# Patient Record
Sex: Male | Born: 1935 | Race: White | Hispanic: No | Marital: Married | Smoking: Former smoker
Health system: Southern US, Community
[De-identification: ages and names within clinical notes are randomized; demographics above are authoritative.]

## PROBLEM LIST (undated history)

## (undated) DIAGNOSIS — N289 Disorder of kidney and ureter, unspecified: Secondary | ICD-10-CM

## (undated) DIAGNOSIS — G629 Polyneuropathy, unspecified: Secondary | ICD-10-CM

## (undated) DIAGNOSIS — G2581 Restless legs syndrome: Secondary | ICD-10-CM

## (undated) DIAGNOSIS — I499 Cardiac arrhythmia, unspecified: Secondary | ICD-10-CM

## (undated) DIAGNOSIS — I6529 Occlusion and stenosis of unspecified carotid artery: Secondary | ICD-10-CM

## (undated) DIAGNOSIS — I340 Nonrheumatic mitral (valve) insufficiency: Secondary | ICD-10-CM

## (undated) HISTORY — PX: CORONARY ARTERY BYPASS GRAFT: SHX141

---

## 2014-11-21 ENCOUNTER — Emergency Department (HOSPITAL_BASED_OUTPATIENT_CLINIC_OR_DEPARTMENT_OTHER)
Admission: EM | Admit: 2014-11-21 | Discharge: 2014-11-22 | Disposition: A | Discharge status: Home / Self Care | Payer: Medicare Other | Attending: Emergency Medicine | Admitting: Emergency Medicine

## 2014-11-21 ENCOUNTER — Encounter (HOSPITAL_BASED_OUTPATIENT_CLINIC_OR_DEPARTMENT_OTHER): Payer: Self-pay | Admitting: Emergency Medicine

## 2014-11-21 DIAGNOSIS — G629 Polyneuropathy, unspecified: Secondary | ICD-10-CM | POA: Diagnosis not present

## 2014-11-21 DIAGNOSIS — Y9389 Activity, other specified: Secondary | ICD-10-CM | POA: Insufficient documentation

## 2014-11-21 DIAGNOSIS — T63441A Toxic effect of venom of bees, accidental (unintentional), initial encounter: Secondary | ICD-10-CM | POA: Diagnosis present

## 2014-11-21 DIAGNOSIS — Z7982 Long term (current) use of aspirin: Secondary | ICD-10-CM | POA: Diagnosis not present

## 2014-11-21 DIAGNOSIS — Y9289 Other specified places as the place of occurrence of the external cause: Secondary | ICD-10-CM | POA: Insufficient documentation

## 2014-11-21 DIAGNOSIS — Z8679 Personal history of other diseases of the circulatory system: Secondary | ICD-10-CM | POA: Insufficient documentation

## 2014-11-21 DIAGNOSIS — Y998 Other external cause status: Secondary | ICD-10-CM | POA: Insufficient documentation

## 2014-11-21 DIAGNOSIS — Z880 Allergy status to penicillin: Secondary | ICD-10-CM | POA: Insufficient documentation

## 2014-11-21 DIAGNOSIS — Z87891 Personal history of nicotine dependence: Secondary | ICD-10-CM | POA: Insufficient documentation

## 2014-11-21 DIAGNOSIS — Z87448 Personal history of other diseases of urinary system: Secondary | ICD-10-CM | POA: Diagnosis not present

## 2014-11-21 HISTORY — DX: Restless legs syndrome: G25.81

## 2014-11-21 HISTORY — DX: Occlusion and stenosis of unspecified carotid artery: I65.29

## 2014-11-21 HISTORY — DX: Disorder of kidney and ureter, unspecified: N28.9

## 2014-11-21 HISTORY — DX: Polyneuropathy, unspecified: G62.9

## 2014-11-21 HISTORY — DX: Cardiac arrhythmia, unspecified: I49.9

## 2014-11-21 HISTORY — DX: Nonrheumatic mitral (valve) insufficiency: I34.0

## 2014-11-22 ENCOUNTER — Encounter (HOSPITAL_BASED_OUTPATIENT_CLINIC_OR_DEPARTMENT_OTHER): Payer: Self-pay | Admitting: Emergency Medicine

## 2014-11-22 DIAGNOSIS — T63441A Toxic effect of venom of bees, accidental (unintentional), initial encounter: Secondary | ICD-10-CM | POA: Diagnosis not present

## 2014-11-22 MED ORDER — FAMOTIDINE 20 MG PO TABS
20.0000 mg | ORAL_TABLET | Freq: Once | ORAL | Status: AC
  Administered 2014-11-22: 20 mg via ORAL
  Filled 2014-11-22: qty 1

## 2014-11-22 MED ORDER — PREDNISONE 10 MG PO TABS: 20.0000 mg | ORAL_TABLET | Freq: Every day | ORAL | Status: DC

## 2014-11-22 MED ORDER — EPINEPHRINE 0.3 MG/0.3ML IJ SOAJ: 0.3000 mg | Freq: Once | INTRAMUSCULAR | Status: DC

## 2014-11-22 MED ORDER — DIPHENHYDRAMINE HCL 25 MG PO CAPS
25.0000 mg | ORAL_CAPSULE | Freq: Once | ORAL | Status: AC
  Administered 2014-11-22: 25 mg via ORAL
  Filled 2014-11-22: qty 1

## 2014-11-22 MED ORDER — PREDNISONE 50 MG PO TABS
60.0000 mg | ORAL_TABLET | Freq: Once | ORAL | Status: AC
  Administered 2014-11-22: 60 mg via ORAL
  Filled 2014-11-22 (×2): qty 1

## 2014-11-22 MED ORDER — FAMOTIDINE 20 MG PO TABS: 20.0000 mg | ORAL_TABLET | Freq: Two times a day (BID) | ORAL | Status: DC
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
## 2014-11-22 NOTE — ED Provider Notes (Signed)
CSN: [id number]     Arrival date & time 11/21/14  2238 History   First MD Initiated Contact with Patient 11/21/14 2359     Chief Complaint  Patient presents with  . Insect Bite     (Consider location/radiation/quality/duration/timing/severity/associated sxs/prior Treatment) Patient is a 81 y.o. male presenting with allergic reaction. The history is provided by the patient.  Allergic Reaction Presenting symptoms: itching and rash   Presenting symptoms: no difficulty breathing, no difficulty swallowing and no wheezing   Severity:  Moderate Prior allergic episodes:  No prior episodes Context: insect bite/sting   Context: no food allergies and no grass   Relieved by:  Nothing Worsened by:  Nothing tried Ineffective treatments:  Antihistamines   Past Medical History  Diagnosis Date  . Mitral valve regurgitation   . Carotid stenosis, non-symptomatic   . Restless leg syndrome   . Peripheral neuropathy   . Irregular heart rate   . Renal disorder    Past Surgical History  Procedure Laterality Date  . Coronary artery bypass graft     History reviewed. No pertinent family history. Social History  Substance Use Topics  . Smoking status: Former Games developer  . Smokeless tobacco: None  . Alcohol Use: No    Review of Systems  HENT: Negative for trouble swallowing.   Respiratory: Negative for wheezing.   Skin: Positive for itching and rash.  All other systems reviewed and are negative.     Allergies  Levaquin; Lipitor; Zithromax; and Penicillins  Home Medications   Prior to Admission medications   Medication Sig Start Date End Date Taking? Authorizing Provider  amLODipine-benazepril (LOTREL) 10-20 MG per capsule Take 1 capsule by mouth daily.   Yes Historical Provider, MD  aspirin 81 MG tablet Take 81 mg by mouth daily.   Yes Historical Provider, MD  fexofenadine (ALLEGRA) 180 MG tablet Take 180 mg by mouth daily.   Yes Historical Provider, MD  gabapentin (NEURONTIN) 300 MG  capsule Take 300 mg by mouth 3 (three) times daily.   Yes Historical Provider, MD  montelukast (SINGULAIR) 10 MG tablet Take 10 mg by mouth at bedtime.   Yes Historical Provider, MD  simvastatin (ZOCOR) 80 MG tablet Take 80 mg by mouth daily.   Yes Historical Provider, MD   BP 130/92 mmHg  Pulse 65  Temp(Src) 97.8 F (36.6 C) (Oral)  Resp 18  Ht 5\' 10"  (1.778 m)  Wt 203 lb (92.08 kg)  BMI 29.13 kg/m2  SpO2 100% Physical Exam  Constitutional: He is oriented to person, place, and time. He appears well-developed and well-nourished. No distress.  HENT:  Head: Normocephalic and atraumatic.  Mouth/Throat: Oropharynx is clear and moist. No oropharyngeal exudate.  No swelling of the lips tongue or uvula  Eyes: Conjunctivae are normal. Pupils are equal, round, and reactive to light.  Neck: Normal range of motion. Neck supple.  Cardiovascular: Normal rate, regular rhythm and intact distal pulses.   Pulmonary/Chest: Effort normal and breath sounds normal. No stridor. No respiratory distress. He has no wheezes. He has no rales.  Abdominal: Soft. Bowel sounds are normal. There is no tenderness. There is no rebound and no guarding.  Musculoskeletal: Normal range of motion.  Neurological: He is alert and oriented to person, place, and time.  Skin: Skin is warm and dry.  Mild whelps at sites of sting    ED Course  Procedures (including critical care time) Labs Review Labs Reviewed - No data to display  Imaging Review No results  found. I, Landry Kamath-RASCH,Barb Shear K, personally reviewed and evaluated these images and lab results as part of my medical decision-making.   EKG Interpretation None      MDM   Final diagnoses:  None   Steroids x 5 days with benadryl every 6 hours, pepcid BIX x 5 days and rx for epi pen.  Strict return precautions given    Xoe Hoe, MD 11/22/14 [phone]

## 2015-04-12 ENCOUNTER — Emergency Department (HOSPITAL_COMMUNITY): Payer: No Typology Code available for payment source

## 2015-04-12 ENCOUNTER — Inpatient Hospital Stay (HOSPITAL_COMMUNITY)
Admission: EM | Admit: 2015-04-12 | Discharge: 2015-05-20 | DRG: 003 | Disposition: A | Discharge status: Other Acute Inpatient Hospital | Payer: No Typology Code available for payment source | Attending: General Surgery | Admitting: General Surgery

## 2015-04-12 ENCOUNTER — Encounter (HOSPITAL_COMMUNITY): Payer: Self-pay | Admitting: Radiology

## 2015-04-12 ENCOUNTER — Inpatient Hospital Stay (HOSPITAL_COMMUNITY): Payer: No Typology Code available for payment source

## 2015-04-12 DIAGNOSIS — T1490 Injury, unspecified: Secondary | ICD-10-CM | POA: Diagnosis not present

## 2015-04-12 DIAGNOSIS — G9340 Encephalopathy, unspecified: Secondary | ICD-10-CM | POA: Diagnosis not present

## 2015-04-12 DIAGNOSIS — S2239XA Fracture of one rib, unspecified side, initial encounter for closed fracture: Secondary | ICD-10-CM | POA: Diagnosis present

## 2015-04-12 DIAGNOSIS — S32031A Stable burst fracture of third lumbar vertebra, initial encounter for closed fracture: Principal | ICD-10-CM | POA: Diagnosis present

## 2015-04-12 DIAGNOSIS — E876 Hypokalemia: Secondary | ICD-10-CM | POA: Diagnosis not present

## 2015-04-12 DIAGNOSIS — R609 Edema, unspecified: Secondary | ICD-10-CM

## 2015-04-12 DIAGNOSIS — I350 Nonrheumatic aortic (valve) stenosis: Secondary | ICD-10-CM | POA: Diagnosis not present

## 2015-04-12 DIAGNOSIS — J189 Pneumonia, unspecified organism: Secondary | ICD-10-CM | POA: Diagnosis not present

## 2015-04-12 DIAGNOSIS — Z930 Tracheostomy status: Secondary | ICD-10-CM

## 2015-04-12 DIAGNOSIS — I2510 Atherosclerotic heart disease of native coronary artery without angina pectoris: Secondary | ICD-10-CM | POA: Diagnosis present

## 2015-04-12 DIAGNOSIS — Z781 Physical restraint status: Secondary | ICD-10-CM

## 2015-04-12 DIAGNOSIS — T17990A Other foreign object in respiratory tract, part unspecified in causing asphyxiation, initial encounter: Secondary | ICD-10-CM | POA: Diagnosis not present

## 2015-04-12 DIAGNOSIS — S298XXA Other specified injuries of thorax, initial encounter: Secondary | ICD-10-CM | POA: Diagnosis not present

## 2015-04-12 DIAGNOSIS — M7989 Other specified soft tissue disorders: Secondary | ICD-10-CM

## 2015-04-12 DIAGNOSIS — J9601 Acute respiratory failure with hypoxia: Secondary | ICD-10-CM | POA: Diagnosis not present

## 2015-04-12 DIAGNOSIS — G629 Polyneuropathy, unspecified: Secondary | ICD-10-CM | POA: Diagnosis present

## 2015-04-12 DIAGNOSIS — R402434 Glasgow coma scale score 3-8, 24 hours or more after hospital admission: Secondary | ICD-10-CM | POA: Diagnosis not present

## 2015-04-12 DIAGNOSIS — I469 Cardiac arrest, cause unspecified: Secondary | ICD-10-CM | POA: Diagnosis not present

## 2015-04-12 DIAGNOSIS — R739 Hyperglycemia, unspecified: Secondary | ICD-10-CM | POA: Diagnosis not present

## 2015-04-12 DIAGNOSIS — J9600 Acute respiratory failure, unspecified whether with hypoxia or hypercapnia: Secondary | ICD-10-CM | POA: Insufficient documentation

## 2015-04-12 DIAGNOSIS — I4891 Unspecified atrial fibrillation: Secondary | ICD-10-CM | POA: Diagnosis not present

## 2015-04-12 DIAGNOSIS — E871 Hypo-osmolality and hyponatremia: Secondary | ICD-10-CM | POA: Diagnosis not present

## 2015-04-12 DIAGNOSIS — Z87891 Personal history of nicotine dependence: Secondary | ICD-10-CM

## 2015-04-12 DIAGNOSIS — N179 Acute kidney failure, unspecified: Secondary | ICD-10-CM | POA: Diagnosis not present

## 2015-04-12 DIAGNOSIS — J9620 Acute and chronic respiratory failure, unspecified whether with hypoxia or hypercapnia: Secondary | ICD-10-CM | POA: Diagnosis not present

## 2015-04-12 DIAGNOSIS — D62 Acute posthemorrhagic anemia: Secondary | ICD-10-CM | POA: Diagnosis not present

## 2015-04-12 DIAGNOSIS — G931 Anoxic brain damage, not elsewhere classified: Secondary | ICD-10-CM | POA: Diagnosis not present

## 2015-04-12 DIAGNOSIS — Z79899 Other long term (current) drug therapy: Secondary | ICD-10-CM

## 2015-04-12 DIAGNOSIS — J9691 Respiratory failure, unspecified with hypoxia: Secondary | ICD-10-CM

## 2015-04-12 DIAGNOSIS — Z419 Encounter for procedure for purposes other than remedying health state, unspecified: Secondary | ICD-10-CM

## 2015-04-12 DIAGNOSIS — I5033 Acute on chronic diastolic (congestive) heart failure: Secondary | ICD-10-CM | POA: Diagnosis not present

## 2015-04-12 DIAGNOSIS — R410 Disorientation, unspecified: Secondary | ICD-10-CM | POA: Insufficient documentation

## 2015-04-12 DIAGNOSIS — S82402A Unspecified fracture of shaft of left fibula, initial encounter for closed fracture: Secondary | ICD-10-CM | POA: Diagnosis present

## 2015-04-12 DIAGNOSIS — J9690 Respiratory failure, unspecified, unspecified whether with hypoxia or hypercapnia: Secondary | ICD-10-CM | POA: Diagnosis not present

## 2015-04-12 DIAGNOSIS — T148XXA Other injury of unspecified body region, initial encounter: Secondary | ICD-10-CM

## 2015-04-12 DIAGNOSIS — S02611A Fracture of condylar process of right mandible, initial encounter for closed fracture: Secondary | ICD-10-CM | POA: Diagnosis present

## 2015-04-12 DIAGNOSIS — A419 Sepsis, unspecified organism: Secondary | ICD-10-CM | POA: Diagnosis not present

## 2015-04-12 DIAGNOSIS — Z951 Presence of aortocoronary bypass graft: Secondary | ICD-10-CM

## 2015-04-12 DIAGNOSIS — J9621 Acute and chronic respiratory failure with hypoxia: Secondary | ICD-10-CM | POA: Diagnosis not present

## 2015-04-12 DIAGNOSIS — S0990XA Unspecified injury of head, initial encounter: Secondary | ICD-10-CM

## 2015-04-12 DIAGNOSIS — R1310 Dysphagia, unspecified: Secondary | ICD-10-CM | POA: Diagnosis not present

## 2015-04-12 DIAGNOSIS — S32029A Unspecified fracture of second lumbar vertebra, initial encounter for closed fracture: Secondary | ICD-10-CM | POA: Diagnosis present

## 2015-04-12 DIAGNOSIS — J690 Pneumonitis due to inhalation of food and vomit: Secondary | ICD-10-CM | POA: Diagnosis not present

## 2015-04-12 DIAGNOSIS — S32039S Unspecified fracture of third lumbar vertebra, sequela: Secondary | ICD-10-CM | POA: Diagnosis not present

## 2015-04-12 DIAGNOSIS — Y92410 Unspecified street and highway as the place of occurrence of the external cause: Secondary | ICD-10-CM | POA: Diagnosis not present

## 2015-04-12 DIAGNOSIS — E861 Hypovolemia: Secondary | ICD-10-CM | POA: Diagnosis not present

## 2015-04-12 DIAGNOSIS — S91312A Laceration without foreign body, left foot, initial encounter: Secondary | ICD-10-CM | POA: Diagnosis present

## 2015-04-12 DIAGNOSIS — R579 Shock, unspecified: Secondary | ICD-10-CM

## 2015-04-12 DIAGNOSIS — E874 Mixed disorder of acid-base balance: Secondary | ICD-10-CM | POA: Diagnosis not present

## 2015-04-12 DIAGNOSIS — Z4659 Encounter for fitting and adjustment of other gastrointestinal appliance and device: Secondary | ICD-10-CM

## 2015-04-12 DIAGNOSIS — E46 Unspecified protein-calorie malnutrition: Secondary | ICD-10-CM | POA: Diagnosis not present

## 2015-04-12 DIAGNOSIS — Y95 Nosocomial condition: Secondary | ICD-10-CM | POA: Diagnosis not present

## 2015-04-12 DIAGNOSIS — F0390 Unspecified dementia without behavioral disturbance: Secondary | ICD-10-CM | POA: Diagnosis present

## 2015-04-12 DIAGNOSIS — R0902 Hypoxemia: Secondary | ICD-10-CM

## 2015-04-12 DIAGNOSIS — T1490XA Injury, unspecified, initial encounter: Secondary | ICD-10-CM

## 2015-04-12 DIAGNOSIS — D72819 Decreased white blood cell count, unspecified: Secondary | ICD-10-CM | POA: Diagnosis not present

## 2015-04-12 DIAGNOSIS — Z9289 Personal history of other medical treatment: Secondary | ICD-10-CM

## 2015-04-12 DIAGNOSIS — H9190 Unspecified hearing loss, unspecified ear: Secondary | ICD-10-CM | POA: Diagnosis present

## 2015-04-12 DIAGNOSIS — M4806 Spinal stenosis, lumbar region: Secondary | ICD-10-CM | POA: Diagnosis present

## 2015-04-12 DIAGNOSIS — Z01810 Encounter for preprocedural cardiovascular examination: Secondary | ICD-10-CM | POA: Diagnosis not present

## 2015-04-12 DIAGNOSIS — Z66 Do not resuscitate: Secondary | ICD-10-CM

## 2015-04-12 DIAGNOSIS — S32039A Unspecified fracture of third lumbar vertebra, initial encounter for closed fracture: Secondary | ICD-10-CM | POA: Diagnosis present

## 2015-04-12 DIAGNOSIS — G936 Cerebral edema: Secondary | ICD-10-CM | POA: Diagnosis not present

## 2015-04-12 DIAGNOSIS — J1520 Pneumonia due to staphylococcus, unspecified: Secondary | ICD-10-CM | POA: Diagnosis not present

## 2015-04-12 DIAGNOSIS — S12100A Unspecified displaced fracture of second cervical vertebra, initial encounter for closed fracture: Secondary | ICD-10-CM | POA: Diagnosis present

## 2015-04-12 DIAGNOSIS — S299XXA Unspecified injury of thorax, initial encounter: Secondary | ICD-10-CM | POA: Insufficient documentation

## 2015-04-12 DIAGNOSIS — G834 Cauda equina syndrome: Secondary | ICD-10-CM | POA: Diagnosis present

## 2015-04-12 DIAGNOSIS — Z0189 Encounter for other specified special examinations: Secondary | ICD-10-CM

## 2015-04-12 DIAGNOSIS — J9811 Atelectasis: Secondary | ICD-10-CM | POA: Diagnosis not present

## 2015-04-12 DIAGNOSIS — E870 Hyperosmolality and hypernatremia: Secondary | ICD-10-CM | POA: Diagnosis not present

## 2015-04-12 DIAGNOSIS — S129XXA Fracture of neck, unspecified, initial encounter: Secondary | ICD-10-CM

## 2015-04-12 DIAGNOSIS — R0602 Shortness of breath: Secondary | ICD-10-CM

## 2015-04-12 DIAGNOSIS — R570 Cardiogenic shock: Secondary | ICD-10-CM | POA: Diagnosis not present

## 2015-04-12 DIAGNOSIS — S069X3S Unspecified intracranial injury with loss of consciousness of 1 hour to 5 hours 59 minutes, sequela: Secondary | ICD-10-CM | POA: Diagnosis not present

## 2015-04-12 DIAGNOSIS — Z7982 Long term (current) use of aspirin: Secondary | ICD-10-CM

## 2015-04-12 DIAGNOSIS — M549 Dorsalgia, unspecified: Secondary | ICD-10-CM | POA: Diagnosis present

## 2015-04-12 DIAGNOSIS — R05 Cough: Secondary | ICD-10-CM

## 2015-04-12 DIAGNOSIS — R001 Bradycardia, unspecified: Secondary | ICD-10-CM | POA: Diagnosis not present

## 2015-04-12 DIAGNOSIS — R34 Anuria and oliguria: Secondary | ICD-10-CM | POA: Diagnosis not present

## 2015-04-12 DIAGNOSIS — Z7951 Long term (current) use of inhaled steroids: Secondary | ICD-10-CM

## 2015-04-12 DIAGNOSIS — R0689 Other abnormalities of breathing: Secondary | ICD-10-CM

## 2015-04-12 DIAGNOSIS — R058 Other specified cough: Secondary | ICD-10-CM

## 2015-04-12 LAB — COMPREHENSIVE METABOLIC PANEL
ALK PHOS: 39 U/L (ref 38–126)
ALT: 32 U/L (ref 17–63)
AST: 59 U/L — ABNORMAL HIGH (ref 15–41)
Albumin: 3.6 g/dL (ref 3.5–5.0)
Anion gap: 12 (ref 5–15)
BUN: 24 mg/dL — ABNORMAL HIGH (ref 6–20)
CALCIUM: 8.9 mg/dL (ref 8.9–10.3)
CO2: 20 mmol/L — AB (ref 22–32)
CREATININE: 2.02 mg/dL — AB (ref 0.61–1.24)
Chloride: 109 mmol/L (ref 101–111)
GFR calc non Af Amer: 30 mL/min — ABNORMAL LOW (ref >60)
GFR, EST AFRICAN AMERICAN: 34 mL/min — AB (ref >60)
GLUCOSE: 169 mg/dL — AB (ref 65–99)
Potassium: 3.8 mmol/L (ref 3.5–5.1)
SODIUM: 141 mmol/L (ref 135–145)
Total Bilirubin: 0.8 mg/dL (ref 0.3–1.2)
Total Protein: 5.3 g/dL — ABNORMAL LOW (ref 6.5–8.1)

## 2015-04-12 LAB — I-STAT CHEM 8, ED
BUN: 30 mg/dL — AB (ref 6–20)
CALCIUM ION: 1.19 mmol/L (ref 1.13–1.30)
CHLORIDE: 105 mmol/L (ref 101–111)
Creatinine, Ser: 1.8 mg/dL — ABNORMAL HIGH (ref 0.61–1.24)
Glucose, Bld: 162 mg/dL — ABNORMAL HIGH (ref 65–99)
HEMATOCRIT: 39 % (ref 39.0–52.0)
Hemoglobin: 13.3 g/dL (ref 13.0–17.0)
Potassium: 3.2 mmol/L — ABNORMAL LOW (ref 3.5–5.1)
SODIUM: 142 mmol/L (ref 135–145)
TCO2: 24 mmol/L (ref 0–100)

## 2015-04-12 LAB — CBC
HCT: 37.3 % — ABNORMAL LOW (ref 39.0–52.0)
Hemoglobin: 12.6 g/dL — ABNORMAL LOW (ref 13.0–17.0)
MCH: 31.3 pg (ref 26.0–34.0)
MCHC: 33.8 g/dL (ref 30.0–36.0)
MCV: 92.8 fL (ref 78.0–100.0)
PLATELETS: 201 10*3/uL (ref 150–400)
RBC: 4.02 MIL/uL — ABNORMAL LOW (ref 4.22–5.81)
RDW: 12.2 % (ref 11.5–15.5)
WBC: 17.4 10*3/uL — ABNORMAL HIGH (ref 4.0–10.5)

## 2015-04-12 LAB — I-STAT CG4 LACTIC ACID, ED
LACTIC ACID, VENOUS: 3.12 mmol/L — AB (ref 0.5–2.0)
Lactic Acid, Venous: 4.56 mmol/L (ref 0.5–2.0)

## 2015-04-12 LAB — ABO/RH: ABO/RH(D): A POS

## 2015-04-12 LAB — ETHANOL

## 2015-04-12 LAB — CDS SEROLOGY

## 2015-04-12 LAB — PROTIME-INR
INR: 1.17 (ref 0.00–1.49)
Prothrombin Time: 15.1 seconds (ref 11.6–15.2)

## 2015-04-12 LAB — MRSA PCR SCREENING: MRSA BY PCR: NEGATIVE

## 2015-04-12 MED ORDER — FENTANYL CITRATE (PF) 100 MCG/2ML IJ SOLN: 25.0000 ug | INTRAMUSCULAR | Status: DC | PRN

## 2015-04-12 MED ORDER — DOCUSATE SODIUM 100 MG PO CAPS
100.0000 mg | ORAL_CAPSULE | Freq: Two times a day (BID) | ORAL | Status: DC
  Administered 2015-04-13 – 2015-04-20 (×6): 100 mg via ORAL
  Filled 2015-04-12 (×10): qty 1

## 2015-04-12 MED ORDER — LORAZEPAM 2 MG/ML IJ SOLN
0.5000 mg | Freq: Once | INTRAMUSCULAR | Status: AC
  Administered 2015-04-12: 0.5 mg via INTRAVENOUS

## 2015-04-12 MED ORDER — ENOXAPARIN SODIUM 30 MG/0.3ML ~~LOC~~ SOLN
30.0000 mg | Freq: Two times a day (BID) | SUBCUTANEOUS | Status: DC
  Administered 2015-04-12 – 2015-05-12 (×59): 30 mg via SUBCUTANEOUS
  Filled 2015-04-12 (×60): qty 0.3

## 2015-04-12 MED ORDER — LIDOCAINE HCL (PF) 1 % IJ SOLN
INTRAMUSCULAR | Status: AC
  Filled 2015-04-12: qty 5

## 2015-04-12 MED ORDER — FENTANYL CITRATE (PF) 100 MCG/2ML IJ SOLN
INTRAMUSCULAR | Status: AC
  Filled 2015-04-12: qty 2

## 2015-04-12 MED ORDER — LIDOCAINE HCL (PF) 1 % IJ SOLN
INTRAMUSCULAR | Status: AC
  Administered 2015-04-12: 5 mL
  Filled 2015-04-12: qty 10

## 2015-04-12 MED ORDER — LORAZEPAM 2 MG/ML IJ SOLN
INTRAMUSCULAR | Status: AC
  Filled 2015-04-12: qty 1

## 2015-04-12 MED ORDER — SODIUM CHLORIDE 0.9 % IV SOLN
Freq: Once | INTRAVENOUS | Status: AC
  Administered 2015-04-12: 05:00:00 via INTRAVENOUS

## 2015-04-12 MED ORDER — ONDANSETRON HCL 4 MG/2ML IJ SOLN: 4.0000 mg | Freq: Four times a day (QID) | INTRAMUSCULAR | Status: DC | PRN

## 2015-04-12 MED ORDER — SODIUM CHLORIDE 0.9 % IV SOLN
INTRAVENOUS | Status: DC
  Administered 2015-04-12 – 2015-04-17 (×5): via INTRAVENOUS
  Filled 2015-04-12 (×11): qty 1000

## 2015-04-12 MED ORDER — CEFAZOLIN SODIUM 1-5 GM-% IV SOLN
1.0000 g | Freq: Once | INTRAVENOUS | Status: AC
  Administered 2015-04-12: 1 g via INTRAVENOUS
  Filled 2015-04-12: qty 50

## 2015-04-12 MED ORDER — SODIUM CHLORIDE 0.9 % IV SOLN
INTRAVENOUS | Status: AC | PRN
  Administered 2015-04-12: 999 mL/h via INTRAVENOUS

## 2015-04-12 MED ORDER — LORAZEPAM 2 MG/ML IJ SOLN
1.0000 mg | Freq: Once | INTRAMUSCULAR | Status: AC
  Administered 2015-04-12: 1 mg via INTRAVENOUS
  Filled 2015-04-12: qty 1

## 2015-04-12 MED ORDER — SODIUM CHLORIDE 0.9 % IV BOLUS (SEPSIS)
1000.0000 mL | Freq: Once | INTRAVENOUS | Status: AC
  Administered 2015-04-12: 1000 mL via INTRAVENOUS

## 2015-04-12 MED ORDER — LIDOCAINE HCL (PF) 1 % IJ SOLN
INTRAMUSCULAR | Status: AC
  Administered 2015-04-12: 10 mL
  Filled 2015-04-12: qty 10

## 2015-04-12 MED ORDER — IOHEXOL 300 MG/ML  SOLN
100.0000 mL | Freq: Once | INTRAMUSCULAR | Status: AC | PRN
  Administered 2015-04-12: 100 mL via INTRAVENOUS

## 2015-04-12 MED ORDER — LORAZEPAM 2 MG/ML IJ SOLN
1.0000 mg | INTRAMUSCULAR | Status: DC | PRN
  Administered 2015-04-12 – 2015-04-22 (×8): 1 mg via INTRAVENOUS
  Filled 2015-04-12 (×9): qty 1

## 2015-04-12 MED ORDER — FENTANYL CITRATE (PF) 100 MCG/2ML IJ SOLN
12.5000 ug | INTRAMUSCULAR | Status: DC | PRN
Start: 2015-04-12 — End: 2015-04-12
  Administered 2015-04-12 (×2): 12.5 ug via INTRAVENOUS
  Filled 2015-04-12 (×2): qty 2

## 2015-04-12 MED ORDER — MORPHINE SULFATE (PF) 2 MG/ML IV SOLN
2.0000 mg | INTRAVENOUS | Status: DC | PRN
  Administered 2015-04-12 – 2015-04-13 (×4): 2 mg via INTRAVENOUS
  Filled 2015-04-12 (×4): qty 1

## 2015-04-12 MED ORDER — POLYETHYLENE GLYCOL 3350 17 G PO PACK
17.0000 g | PACK | Freq: Every day | ORAL | Status: DC
  Administered 2015-04-15 – 2015-04-23 (×5): 17 g via ORAL
  Filled 2015-04-12 (×5): qty 1

## 2015-04-12 MED ORDER — FENTANYL CITRATE (PF) 100 MCG/2ML IJ SOLN
50.0000 ug | Freq: Once | INTRAMUSCULAR | Status: AC
  Administered 2015-04-12: 50 ug via INTRAVENOUS

## 2015-04-12 MED ORDER — TRAMADOL HCL 50 MG PO TABS
50.0000 mg | ORAL_TABLET | Freq: Four times a day (QID) | ORAL | Status: DC | PRN
  Administered 2015-04-12: 100 mg via ORAL
  Administered 2015-04-13 – 2015-04-14 (×2): 50 mg via ORAL
  Filled 2015-04-12: qty 1
  Filled 2015-04-12: qty 2
  Filled 2015-04-12 (×2): qty 1

## 2015-04-12 MED ORDER — IOHEXOL 350 MG/ML SOLN
50.0000 mL | Freq: Once | INTRAVENOUS | Status: AC | PRN
  Administered 2015-04-12: 50 mL via INTRAVENOUS

## 2015-04-12 MED ORDER — ONDANSETRON HCL 4 MG PO TABS: 4.0000 mg | ORAL_TABLET | Freq: Four times a day (QID) | ORAL | Status: DC | PRN
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
## 2015-04-12 NOTE — Progress Notes (Signed)
Patient agitated and won't lie still.  Disoriented.  Oxygen saturation 94% on 2L by [place].  Not sure if the patient will remain stable on his back for the next two days.  Given Ativan with some good results.  Marta LamasJames O. Gae BonWyatt, III, MD, FACS [phone] Trauma Surgeon

## 2015-04-12 NOTE — ED Notes (Signed)
Pt requesting pain medication. MD made aware. 
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
## 2015-04-12 NOTE — Discharge Instructions (Signed)
Maintain a soft diet for 6 weeks.

## 2015-04-12 NOTE — Progress Notes (Signed)
Dr. Lindie SpruceWyatt in to see patient at this time. Aware of confusion, agitation, and tachycardia. Patient more calm since ativan dose and family at bedside. Will continue to monitor patient.
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
## 2015-04-13 ENCOUNTER — Inpatient Hospital Stay (HOSPITAL_COMMUNITY): Payer: No Typology Code available for payment source

## 2015-04-13 DIAGNOSIS — Z01810 Encounter for preprocedural cardiovascular examination: Secondary | ICD-10-CM

## 2015-04-13 DIAGNOSIS — I350 Nonrheumatic aortic (valve) stenosis: Secondary | ICD-10-CM

## 2015-04-13 DIAGNOSIS — T1490 Injury, unspecified: Secondary | ICD-10-CM

## 2015-04-13 DIAGNOSIS — Z951 Presence of aortocoronary bypass graft: Secondary | ICD-10-CM

## 2015-04-13 LAB — PREPARE FRESH FROZEN PLASMA
UNIT DIVISION: 0
Unit division: 0

## 2015-04-13 LAB — BASIC METABOLIC PANEL
Anion gap: 6 (ref 5–15)
BUN: 29 mg/dL — AB (ref 6–20)
CALCIUM: 8.6 mg/dL — AB (ref 8.9–10.3)
CHLORIDE: 112 mmol/L — AB (ref 101–111)
CO2: 24 mmol/L (ref 22–32)
Creatinine, Ser: 1.56 mg/dL — ABNORMAL HIGH (ref 0.61–1.24)
GFR calc Af Amer: 47 mL/min — ABNORMAL LOW (ref >60)
GFR calc non Af Amer: 41 mL/min — ABNORMAL LOW (ref >60)
Glucose, Bld: 153 mg/dL — ABNORMAL HIGH (ref 65–99)
Potassium: 4.7 mmol/L (ref 3.5–5.1)
Sodium: 142 mmol/L (ref 135–145)

## 2015-04-13 LAB — BLOOD PRODUCT ORDER (VERBAL) VERIFICATION

## 2015-04-13 LAB — PREPARE RBC (CROSSMATCH)

## 2015-04-13 MED ORDER — CHLORHEXIDINE GLUCONATE 0.12 % MT SOLN
15.0000 mL | Freq: Two times a day (BID) | OROMUCOSAL | Status: DC
  Administered 2015-04-13 – 2015-04-18 (×11): 15 mL via OROMUCOSAL
  Filled 2015-04-13 (×4): qty 15

## 2015-04-13 MED ORDER — CETYLPYRIDINIUM CHLORIDE 0.05 % MT LIQD
7.0000 mL | Freq: Two times a day (BID) | OROMUCOSAL | Status: DC
  Administered 2015-04-13 – 2015-04-18 (×9): 7 mL via OROMUCOSAL

## 2015-04-13 MED ORDER — DEXTROSE 5 % IV SOLN
2.0000 g | INTRAVENOUS | Status: DC
  Administered 2015-04-13 – 2015-04-20 (×6): 2 g via INTRAVENOUS
  Filled 2015-04-13 (×10): qty 2

## 2015-04-13 MED ORDER — VANCOMYCIN HCL IN DEXTROSE 1-5 GM/200ML-% IV SOLN
1000.0000 mg | INTRAVENOUS | Status: AC
  Administered 2015-04-14: 1000 mg via INTRAVENOUS
  Filled 2015-04-13 (×2): qty 200

## 2015-04-13 MED ORDER — HALOPERIDOL LACTATE 5 MG/ML IJ SOLN
INTRAMUSCULAR | Status: AC
  Administered 2015-04-13: 5 mg
  Filled 2015-04-13: qty 1
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
## 2015-04-13 NOTE — Progress Notes (Signed)
  Echocardiogram 2D Echocardiogram has been performed.  [name], [name] M 04/13/2015, 2:49 PM2
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
## 2015-04-13 NOTE — Progress Notes (Signed)
Patient ID: Ian KnackRobert Marks, male   DOB: [birth year], 82 y.o.   MRN: [id number] Vital signs are stable Patient notes that back pain is under reasonable control Able to move both lower extremities well Continues to bedrest 08 surgery per Dr. Jordan LikesPool later this week
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
## 2015-04-14 ENCOUNTER — Encounter (HOSPITAL_COMMUNITY)
Admission: EM | Disposition: A | Discharge status: Nursing Home (Includes ICF) | Payer: Medicare Other | Source: Home / Self Care

## 2015-04-14 ENCOUNTER — Encounter (HOSPITAL_COMMUNITY): Payer: Self-pay | Admitting: Certified Registered Nurse Anesthetist

## 2015-04-14 ENCOUNTER — Inpatient Hospital Stay (HOSPITAL_COMMUNITY): Payer: No Typology Code available for payment source | Admitting: Certified Registered Nurse Anesthetist

## 2015-04-14 ENCOUNTER — Inpatient Hospital Stay (HOSPITAL_COMMUNITY): Payer: No Typology Code available for payment source

## 2015-04-14 DIAGNOSIS — S32039A Unspecified fracture of third lumbar vertebra, initial encounter for closed fracture: Secondary | ICD-10-CM | POA: Diagnosis present

## 2015-04-14 HISTORY — PX: POSTERIOR LUMBAR FUSION 4 LEVEL: SHX6037

## 2015-04-14 LAB — POCT I-STAT 7, (LYTES, BLD GAS, ICA,H+H)
ACID-BASE DEFICIT: 4 mmol/L — AB (ref 0.0–2.0)
Acid-base deficit: 5 mmol/L — ABNORMAL HIGH (ref 0.0–2.0)
Bicarbonate: 21.6 meq/L (ref 20.0–24.0)
Bicarbonate: 20.8 meq/L (ref 20.0–24.0)
Calcium, Ion: 1.27 mmol/L (ref 1.13–1.30)
Calcium, Ion: 1.24 mmol/L (ref 1.13–1.30)
HCT: 21 % — ABNORMAL LOW (ref 39.0–52.0)
HEMATOCRIT: 22 % — AB (ref 39.0–52.0)
HEMOGLOBIN: 7.1 g/dL — AB (ref 13.0–17.0)
Hemoglobin: 7.5 g/dL — ABNORMAL LOW (ref 13.0–17.0)
O2 SAT: 100 %
O2 SAT: 96 %
PCO2 ART: 41.6 mmHg (ref 35.0–45.0)
PCO2 ART: 40.4 mmHg (ref 35.0–45.0)
PH ART: 7.325 — AB (ref 7.350–7.450)
PO2 ART: 94 mmHg (ref 80.0–100.0)
POTASSIUM: 4 mmol/L (ref 3.5–5.1)
Patient temperature: 37.6
Potassium: 4.6 mmol/L (ref 3.5–5.1)
Sodium: 141 mmol/L (ref 135–145)
Sodium: 140 mmol/L (ref 135–145)
TCO2: 23 mmol/L (ref 0–100)
TCO2: 22 mmol/L (ref 0–100)
pH, Arterial: 7.322 — ABNORMAL LOW (ref 7.350–7.450)
pO2, Arterial: 266 mmHg — ABNORMAL HIGH (ref 80.0–100.0)

## 2015-04-14 LAB — CBC
HCT: 27.3 % — ABNORMAL LOW (ref 39.0–52.0)
HEMATOCRIT: 26.6 % — AB (ref 39.0–52.0)
HEMOGLOBIN: 9 g/dL — AB (ref 13.0–17.0)
HEMOGLOBIN: 9.2 g/dL — AB (ref 13.0–17.0)
MCH: 31.3 pg (ref 26.0–34.0)
MCH: 31.2 pg (ref 26.0–34.0)
MCHC: 33.8 g/dL (ref 30.0–36.0)
MCHC: 33.7 g/dL (ref 30.0–36.0)
MCV: 92.4 fL (ref 78.0–100.0)
MCV: 92.5 fL (ref 78.0–100.0)
PLATELETS: 104 10*3/uL — AB (ref 150–400)
Platelets: 111 10*3/uL — ABNORMAL LOW (ref 150–400)
RBC: 2.88 MIL/uL — AB (ref 4.22–5.81)
RBC: 2.95 MIL/uL — AB (ref 4.22–5.81)
RDW: 12.8 % (ref 11.5–15.5)
RDW: 12.8 % (ref 11.5–15.5)
WBC: 15.8 10*3/uL — ABNORMAL HIGH (ref 4.0–10.5)
WBC: 17 10*3/uL — ABNORMAL HIGH (ref 4.0–10.5)

## 2015-04-14 LAB — PREPARE RBC (CROSSMATCH)

## 2015-04-14 SURGERY — POSTERIOR LUMBAR FUSION 4 LEVEL
Anesthesia: General | Site: Back

## 2015-04-14 MED ORDER — GLYCOPYRROLATE 0.2 MG/ML IJ SOLN
INTRAMUSCULAR | Status: AC
  Filled 2015-04-14: qty 3

## 2015-04-14 MED ORDER — PHENYLEPHRINE HCL 10 MG/ML IJ SOLN
INTRAMUSCULAR | Status: DC | PRN
  Administered 2015-04-14: 80 ug via INTRAVENOUS

## 2015-04-14 MED ORDER — PROPOFOL 10 MG/ML IV BOLUS
INTRAVENOUS | Status: DC | PRN
  Administered 2015-04-14: 50 mg via INTRAVENOUS

## 2015-04-14 MED ORDER — SODIUM CHLORIDE 0.9 % IV SOLN
0.0300 [IU]/min | INTRAVENOUS | Status: DC
  Administered 2015-04-14: .04 [IU]/min via INTRAVENOUS
  Filled 2015-04-14: qty 2

## 2015-04-14 MED ORDER — PHENYLEPHRINE HCL 10 MG/ML IJ SOLN
10.0000 mg | INTRAVENOUS | Status: DC | PRN
  Administered 2015-04-14: 50 ug/min via INTRAVENOUS

## 2015-04-14 MED ORDER — LACTATED RINGERS IV SOLN
INTRAVENOUS | Status: DC | PRN
  Administered 2015-04-14: 09:00:00 via INTRAVENOUS

## 2015-04-14 MED ORDER — VANCOMYCIN HCL 1000 MG IV SOLR
INTRAVENOUS | Status: AC
  Filled 2015-04-14: qty 1000

## 2015-04-14 MED ORDER — ETOMIDATE 2 MG/ML IV SOLN
INTRAVENOUS | Status: DC | PRN
  Administered 2015-04-14: 8 mg via INTRAVENOUS

## 2015-04-14 MED ORDER — HALOPERIDOL LACTATE 5 MG/ML IJ SOLN: 5.0000 mg | Freq: Once | INTRAMUSCULAR | Status: DC

## 2015-04-14 MED ORDER — VANCOMYCIN HCL 1000 MG IV SOLR
INTRAVENOUS | Status: DC | PRN
  Administered 2015-04-14: 1000 mg via TOPICAL

## 2015-04-14 MED ORDER — MECLIZINE HCL 25 MG PO TABS
25.0000 mg | ORAL_TABLET | Freq: Every day | ORAL | Status: DC | PRN
  Filled 2015-04-14: qty 1

## 2015-04-14 MED ORDER — SODIUM CHLORIDE 0.9 % IV SOLN: 250.0000 mL | INTRAVENOUS | Status: DC

## 2015-04-14 MED ORDER — 0.9 % SODIUM CHLORIDE (POUR BTL) OPTIME
TOPICAL | Status: DC | PRN
  Administered 2015-04-14: 1000 mL

## 2015-04-14 MED ORDER — HYDROCODONE-ACETAMINOPHEN 5-325 MG PO TABS
1.0000 | ORAL_TABLET | ORAL | Status: DC | PRN
  Administered 2015-04-15: 2 via ORAL
  Administered 2015-04-18 (×2): 1 via ORAL
  Administered 2015-04-20 (×2): 2 via ORAL
  Filled 2015-04-14: qty 2
  Filled 2015-04-14 (×2): qty 1
  Filled 2015-04-14 (×2): qty 2

## 2015-04-14 MED ORDER — FENTANYL CITRATE (PF) 250 MCG/5ML IJ SOLN
INTRAMUSCULAR | Status: AC
  Filled 2015-04-14: qty 5

## 2015-04-14 MED ORDER — ALBUMIN HUMAN 5 % IV SOLN: 12.5000 g | Freq: Once | INTRAVENOUS | Status: AC

## 2015-04-14 MED ORDER — ALBUMIN HUMAN 5 % IV SOLN
INTRAVENOUS | Status: AC
  Administered 2015-04-14: 12.5 g
  Filled 2015-04-14: qty 250

## 2015-04-14 MED ORDER — OXYCODONE-ACETAMINOPHEN 5-325 MG PO TABS
1.0000 | ORAL_TABLET | ORAL | Status: DC | PRN
  Administered 2015-04-14: 2 via ORAL
  Filled 2015-04-14: qty 2

## 2015-04-14 MED ORDER — MENTHOL 3 MG MT LOZG: 1.0000 | LOZENGE | OROMUCOSAL | Status: DC | PRN

## 2015-04-14 MED ORDER — ACETAMINOPHEN 325 MG PO TABS
650.0000 mg | ORAL_TABLET | ORAL | Status: DC | PRN
  Administered 2015-04-14 – 2015-05-07 (×4): 650 mg via ORAL
  Filled 2015-04-14 (×4): qty 2

## 2015-04-14 MED ORDER — VASOPRESSIN 20 UNIT/ML IV SOLN
INTRAVENOUS | Status: DC | PRN
  Administered 2015-04-14: 1 [IU] via INTRAVENOUS

## 2015-04-14 MED ORDER — SURGIFOAM 100 EX MISC
CUTANEOUS | Status: DC | PRN
  Administered 2015-04-14 (×2): via TOPICAL

## 2015-04-14 MED ORDER — PHENOL 1.4 % MT LIQD
1.0000 | OROMUCOSAL | Status: DC | PRN
  Administered 2015-04-16: 1 via OROMUCOSAL
  Filled 2015-04-14 (×2): qty 177

## 2015-04-14 MED ORDER — SUCCINYLCHOLINE CHLORIDE 20 MG/ML IJ SOLN
INTRAMUSCULAR | Status: DC | PRN
  Administered 2015-04-14: 100 mg via INTRAVENOUS

## 2015-04-14 MED ORDER — HALOPERIDOL LACTATE 5 MG/ML IJ SOLN
5.0000 mg | Freq: Four times a day (QID) | INTRAMUSCULAR | Status: DC | PRN
Start: 2015-04-14 — End: 2015-04-23
  Administered 2015-04-14 – 2015-04-18 (×7): 5 mg via INTRAVENOUS
  Filled 2015-04-14 (×7): qty 1

## 2015-04-14 MED ORDER — VANCOMYCIN HCL IN DEXTROSE 750-5 MG/150ML-% IV SOLN
750.0000 mg | Freq: Two times a day (BID) | INTRAVENOUS | Status: DC
  Administered 2015-04-14 – 2015-04-15 (×2): 750 mg via INTRAVENOUS
  Filled 2015-04-14 (×3): qty 150

## 2015-04-14 MED ORDER — METOPROLOL TARTRATE 50 MG PO TABS
50.0000 mg | ORAL_TABLET | Freq: Two times a day (BID) | ORAL | Status: DC
  Administered 2015-04-14 – 2015-04-15 (×2): 50 mg via ORAL
  Filled 2015-04-14 (×2): qty 1

## 2015-04-14 MED ORDER — DIAZEPAM 5 MG PO TABS: 5.0000 mg | ORAL_TABLET | Freq: Four times a day (QID) | ORAL | Status: DC | PRN

## 2015-04-14 MED ORDER — HYDROMORPHONE HCL 1 MG/ML IJ SOLN
0.5000 mg | INTRAMUSCULAR | Status: DC | PRN
  Administered 2015-04-15 – 2015-04-16 (×3): 0.5 mg via INTRAVENOUS
  Administered 2015-04-16 (×2): 1 mg via INTRAVENOUS
  Administered 2015-04-16 – 2015-04-21 (×10): 0.5 mg via INTRAVENOUS
  Administered 2015-04-21: 1 mg via INTRAVENOUS
  Administered 2015-04-21 – 2015-04-22 (×2): 0.5 mg via INTRAVENOUS
  Administered 2015-04-23: 1 mg via INTRAVENOUS
  Filled 2015-04-14 (×16): qty 1

## 2015-04-14 MED ORDER — ACETAMINOPHEN 650 MG RE SUPP: 650.0000 mg | RECTAL | Status: DC | PRN

## 2015-04-14 MED ORDER — LIDOCAINE HCL (CARDIAC) 20 MG/ML IV SOLN
INTRAVENOUS | Status: DC | PRN
Start: 2015-04-14 — End: 2015-04-14
  Administered 2015-04-14: 80 mg via INTRAVENOUS

## 2015-04-14 MED ORDER — ONDANSETRON HCL 4 MG/2ML IJ SOLN
INTRAMUSCULAR | Status: DC | PRN
  Administered 2015-04-14: 4 mg via INTRAVENOUS

## 2015-04-14 MED ORDER — METOPROLOL TARTRATE 1 MG/ML IV SOLN: 2.5000 mg | Freq: Once | INTRAVENOUS | Status: DC

## 2015-04-14 MED ORDER — NEOSTIGMINE METHYLSULFATE 10 MG/10ML IV SOLN
INTRAVENOUS | Status: DC | PRN
  Administered 2015-04-14: 5 mg via INTRAVENOUS

## 2015-04-14 MED ORDER — THROMBIN 5000 UNITS EX SOLR
OROMUCOSAL | Status: DC | PRN
  Administered 2015-04-14: 12:00:00 via TOPICAL

## 2015-04-14 MED ORDER — FENTANYL CITRATE (PF) 100 MCG/2ML IJ SOLN
INTRAMUSCULAR | Status: DC | PRN
  Administered 2015-04-14 (×2): 50 ug via INTRAVENOUS
  Administered 2015-04-14: 25 ug via INTRAVENOUS
  Administered 2015-04-14: 50 ug via INTRAVENOUS
  Administered 2015-04-14: 100 ug via INTRAVENOUS
  Administered 2015-04-14: 50 ug via INTRAVENOUS
  Administered 2015-04-14: 200 ug via INTRAVENOUS
  Administered 2015-04-14: 25 ug via INTRAVENOUS
  Administered 2015-04-14 (×2): 50 ug via INTRAVENOUS

## 2015-04-14 MED ORDER — VANCOMYCIN HCL IN DEXTROSE 1-5 GM/200ML-% IV SOLN
INTRAVENOUS | Status: AC
  Administered 2015-04-15: 750 mg via INTRAVENOUS
  Filled 2015-04-14: qty 200

## 2015-04-14 MED ORDER — NEOSTIGMINE METHYLSULFATE 10 MG/10ML IV SOLN
INTRAVENOUS | Status: AC
  Filled 2015-04-14: qty 1

## 2015-04-14 MED ORDER — ETOMIDATE 2 MG/ML IV SOLN
INTRAVENOUS | Status: AC
  Filled 2015-04-14: qty 10

## 2015-04-14 MED ORDER — SODIUM CHLORIDE 0.9 % IJ SOLN
3.0000 mL | Freq: Two times a day (BID) | INTRAMUSCULAR | Status: DC
Start: 2015-04-14 — End: 2015-04-23
  Administered 2015-04-14: 3 mL via INTRAVENOUS
  Administered 2015-04-15: 9 mL via INTRAVENOUS
  Administered 2015-04-15 – 2015-04-23 (×13): 3 mL via INTRAVENOUS

## 2015-04-14 MED ORDER — SODIUM CHLORIDE 0.9 % IJ SOLN
3.0000 mL | INTRAMUSCULAR | Status: DC | PRN
Start: 2015-04-14 — End: 2015-04-23

## 2015-04-14 MED ORDER — ROCURONIUM BROMIDE 100 MG/10ML IV SOLN
INTRAVENOUS | Status: DC | PRN
  Administered 2015-04-14: 10 mg via INTRAVENOUS
  Administered 2015-04-14: 30 mg via INTRAVENOUS
  Administered 2015-04-14 (×2): 20 mg via INTRAVENOUS

## 2015-04-14 MED ORDER — GLYCOPYRROLATE 0.2 MG/ML IJ SOLN
INTRAMUSCULAR | Status: DC | PRN
  Administered 2015-04-14: 0.6 mg via INTRAVENOUS

## 2015-04-14 MED ORDER — IPRATROPIUM-ALBUTEROL 0.5-2.5 (3) MG/3ML IN SOLN
3.0000 mL | Freq: Four times a day (QID) | RESPIRATORY_TRACT | Status: DC
  Administered 2015-04-14 – 2015-04-18 (×16): 3 mL via RESPIRATORY_TRACT
  Filled 2015-04-14 (×18): qty 3

## 2015-04-14 MED ORDER — SODIUM CHLORIDE 0.9 % IV SOLN
Freq: Once | INTRAVENOUS | Status: AC
  Administered 2015-04-14: 12:00:00 via INTRAVENOUS

## 2015-04-14 MED ORDER — SODIUM CHLORIDE 0.9 % IR SOLN
Status: DC | PRN
  Administered 2015-04-14: 12:00:00

## 2015-04-14 MED ORDER — ALBUMIN HUMAN 5 % IV SOLN
INTRAVENOUS | Status: DC | PRN
  Administered 2015-04-14: 10:00:00 via INTRAVENOUS

## 2015-04-14 MED ORDER — PANTOPRAZOLE SODIUM 40 MG PO TBEC
40.0000 mg | DELAYED_RELEASE_TABLET | Freq: Two times a day (BID) | ORAL | Status: DC
  Administered 2015-04-14 – 2015-04-15 (×2): 40 mg via ORAL
  Filled 2015-04-14 (×2): qty 1

## 2015-04-14 MED FILL — Sodium Chloride IV Soln 0.9%: INTRAVENOUS | Qty: 1000 | Status: AC

## 2015-04-14 MED FILL — Sodium Chloride Irrigation Soln 0.9%: Qty: 3000 | Status: AC

## 2015-04-14 MED FILL — Heparin Sodium (Porcine) Inj 1000 Unit/ML: INTRAMUSCULAR | Qty: 30 | Status: AC

## 2015-04-14 SURGICAL SUPPLY — 59 items
BAG DECANTER FOR FLEXI CONT (MISCELLANEOUS) ×2 IMPLANT
BENZOIN TINCTURE PRP APPL 2/3 (GAUZE/BANDAGES/DRESSINGS) ×2 IMPLANT
BLADE CLIPPER SURG (BLADE) ×2 IMPLANT
BRUSH SCRUB EZ PLAIN DRY (MISCELLANEOUS) ×2 IMPLANT
BUR CUTTER 7.0 ROUND (BURR) ×2 IMPLANT
BUR MATCHSTICK NEURO 3.0 LAGG (BURR) ×4 IMPLANT
CANISTER SUCT 3000ML PPV (MISCELLANEOUS) ×2 IMPLANT
CAP LCK SPNE (Orthopedic Implant) ×8 IMPLANT
CAP LOCK SPINE RADIUS (Orthopedic Implant) ×8 IMPLANT
CAP LOCKING (Orthopedic Implant) ×8 IMPLANT
CONT SPEC 4OZ CLIKSEAL STRL BL (MISCELLANEOUS) ×2 IMPLANT
COVER BACK TABLE 60X90IN (DRAPES) ×2 IMPLANT
CROSSLINK LONG (Orthopedic Implant) ×2 IMPLANT
DECANTER SPIKE VIAL GLASS SM (MISCELLANEOUS) ×2 IMPLANT
DRAPE C-ARM 42X72 X-RAY (DRAPES) ×4 IMPLANT
DRAPE LAPAROTOMY 100X72X124 (DRAPES) ×2 IMPLANT
DRAPE POUCH INSTRU U-SHP 10X18 (DRAPES) ×2 IMPLANT
DRAPE PROXIMA HALF (DRAPES) IMPLANT
DRAPE SURG 17X23 STRL (DRAPES) ×8 IMPLANT
DRSG OPSITE POSTOP 4X10 (GAUZE/BANDAGES/DRESSINGS) ×2 IMPLANT
DURAPREP 26ML APPLICATOR (WOUND CARE) ×2 IMPLANT
ELECT REM PT RETURN 9FT ADLT (ELECTROSURGICAL) ×2
ELECTRODE REM PT RTRN 9FT ADLT (ELECTROSURGICAL) ×1 IMPLANT
EVACUATOR 1/8 PVC DRAIN (DRAIN) ×2 IMPLANT
GAUZE SPONGE 4X4 12PLY STRL (GAUZE/BANDAGES/DRESSINGS) IMPLANT
GAUZE SPONGE 4X4 16PLY XRAY LF (GAUZE/BANDAGES/DRESSINGS) ×4 IMPLANT
GLOVE ECLIPSE 9.0 STRL (GLOVE) ×4 IMPLANT
GLOVE EXAM NITRILE LRG STRL (GLOVE) IMPLANT
GLOVE EXAM NITRILE MD LF STRL (GLOVE) IMPLANT
GLOVE EXAM NITRILE XL STR (GLOVE) IMPLANT
GLOVE EXAM NITRILE XS STR PU (GLOVE) IMPLANT
GOWN STRL REUS W/ TWL LRG LVL3 (GOWN DISPOSABLE) ×2 IMPLANT
GOWN STRL REUS W/ TWL XL LVL3 (GOWN DISPOSABLE) ×2 IMPLANT
GOWN STRL REUS W/TWL 2XL LVL3 (GOWN DISPOSABLE) IMPLANT
GOWN STRL REUS W/TWL LRG LVL3 (GOWN DISPOSABLE) ×2
GOWN STRL REUS W/TWL XL LVL3 (GOWN DISPOSABLE) ×2
HEMOSTAT POWDER KIT SURGIFOAM (HEMOSTASIS) ×2 IMPLANT
KIT BASIN OR (CUSTOM PROCEDURE TRAY) ×2 IMPLANT
KIT ROOM TURNOVER OR (KITS) ×2 IMPLANT
LIQUID BAND (GAUZE/BANDAGES/DRESSINGS) ×2 IMPLANT
MILL MEDIUM DISP (BLADE) ×2 IMPLANT
NEEDLE HYPO 22GX1.5 SAFETY (NEEDLE) ×2 IMPLANT
NS IRRIG 1000ML POUR BTL (IV SOLUTION) ×2 IMPLANT
PACK LAMINECTOMY NEURO (CUSTOM PROCEDURE TRAY) ×2 IMPLANT
ROD 120MM (Rod) IMPLANT
ROD 140MM (Rod) ×4 IMPLANT
ROD SPNL 120X5.5XNS TI RDS (Rod) IMPLANT
SCREW 6.75X45MM (Screw) ×16 IMPLANT
SPONGE SURGIFOAM ABS GEL 100 (HEMOSTASIS) ×2 IMPLANT
STRIP CLOSURE SKIN 1/2X4 (GAUZE/BANDAGES/DRESSINGS) ×2 IMPLANT
SUT VIC AB 0 CT1 18XCR BRD8 (SUTURE) ×1 IMPLANT
SUT VIC AB 0 CT1 8-18 (SUTURE) ×1
SUT VIC AB 2-0 CT1 18 (SUTURE) ×4 IMPLANT
SUT VIC AB 3-0 SH 8-18 (SUTURE) ×2 IMPLANT
TOWEL OR 17X24 6PK STRL BLUE (TOWEL DISPOSABLE) ×2 IMPLANT
TOWEL OR 17X26 10 PK STRL BLUE (TOWEL DISPOSABLE) ×2 IMPLANT
TRAY FOLEY CATH 16FRSI W/METER (SET/KITS/TRAYS/PACK) ×2 IMPLANT
TRAY FOLEY W/METER SILVER 14FR (SET/KITS/TRAYS/PACK) IMPLANT
WATER STERILE IRR 1000ML POUR (IV SOLUTION) ×2 IMPLANT

## 2015-04-14 NOTE — Progress Notes (Signed)
Orthopedic Tech Progress Note Patient Details:  Ian KnackRobert Marks June 21, 1933 [id number]  Patient ID: [name], male   DOB: June 21, 1933, 82 y.o.   MRN: [id number] Called in bio-tech brace order; spoke with Anderson MaltaStephanie  [name] 04/14/2015, 9:15 AM
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
## 2015-04-14 NOTE — Progress Notes (Signed)
Patient ID: Ervin KnackRobert Albarran, male   DOB: 10/01/1933, 82 y.o.   MRN: [id number] Back from OR extubated. Will add Duonebs scheduled. Watch pulm status closely. Violeta GelinasBurke Aleka Twitty, MD, MPH, FACS Trauma: [phone] General Surgery: [phone]

## 2015-04-14 NOTE — Progress Notes (Signed)
Patient ID: Ervin KnackRobert Stehlik, male   DOB: 1933/07/04, 82 y.o.   MRN: [id number] I changed both of his foot dressings at the bedside with family present.  Both incisions had intact sutures.  New xeroform and dry dressing applied.  Will order bilateral post-op shoes.

## 2015-04-14 NOTE — Progress Notes (Signed)
Orthopedic Tech Progress Note Patient Details:  Ervin KnackRobert Stemen 10-Oct-1933 [id number]  Ortho Devices Type of Ortho Device: Postop shoe/boot Ortho Device/Splint Location: (B) LE Ortho Device/Splint Interventions: Ordered, Application   Jennye MoccasinHughes, Cylah Fannin Craig 04/14/2015, 3:29 PM

## 2015-04-14 NOTE — OR Nursing (Signed)
Condom cath removed from pt @ 1028 w/ 500cc of yellow urine noted in foley bag.
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
## 2015-04-14 NOTE — Anesthesia Procedure Notes (Signed)
Procedure Name: Intubation Date/Time: 04/14/2015 10:20 AM Performed by: Faustino CongressWHITE, Hussain Maimone TENA Muneeb Veras Pre-anesthesia Checklist: Patient identified, Emergency Drugs available, Suction available and Patient being monitored Patient Re-evaluated:Patient Re-evaluated prior to inductionOxygen Delivery Method: Circle system utilized Preoxygenation: Pre-oxygenation with 100% oxygen Intubation Type: IV induction Ventilation: Mask ventilation without difficulty Laryngoscope Size: Glidescope and 4 Grade View: Grade I Tube type: Subglottic suction tube Tube size: 8.5 mm Number of attempts: 1 Airway Equipment and Method: Stylet and Video-laryngoscopy Placement Confirmation: ETT inserted through vocal cords under direct vision,  positive ETCO2 and breath sounds checked- equal and bilateral Secured at: 24 cm Tube secured with: Tape Dental Injury: Teeth and Oropharynx as per pre-operative assessment

## 2015-04-14 NOTE — Brief Op Note (Signed)
04/12/2015 - 04/14/2015  1:06 PM  PATIENT:  Ervin Knackobert Ahart  82 y.o. male  PRE-OPERATIVE DIAGNOSIS:  L2 L3 fracture stenosis  POST-OPERATIVE DIAGNOSIS:  L2 L3 fracture stenosis  PROCEDURE:  Procedure(s): POSTERIOR LUMBAR FUSION LUMBAR ONE TO LUMBAR FIVE LUMBAR;  DECOMPRESSION LAMINECTOMY LUMBAR TWO-LUMBAR FOUR (N/A)  SURGEON:  Surgeon(s) and Role:    * Julio SicksHenry Angeliyah Kirkey, MD - Primary  PHYSICIAN ASSISTANT:   ASSISTANTS:    ANESTHESIA:   general  EBL:  Total I/O In: 1445 [I.V.:375; Blood:820; IV Piggyback:250] Out: 1570 [Urine:820; Blood:750]  BLOOD ADMINISTERED:none  DRAINS: (large) Hemovact drain(s) in the Epidural space with  Suction Open   LOCAL MEDICATIONS USED:  MARCAINE     SPECIMEN:  No Specimen  DISPOSITION OF SPECIMEN:  N/A  COUNTS:  YES  TOURNIQUET:  * No tourniquets in log *  DICTATION: .Dragon Dictation  PLAN OF CARE: Admit to inpatient   PATIENT DISPOSITION:  PACU - hemodynamically stable.   Delay start of Pharmacological VTE agent (>24hrs) due to surgical blood loss or risk of bleeding: yes

## 2015-04-14 NOTE — Progress Notes (Signed)
Orthopedic Tech Progress Note Patient Details:  Ervin KnackRobert Witzke [birth year] [id number] Brace order completed by bio-tech vendor. Patient ID: Ervin KnackRobert Poitra, male   DOB: [birth year], 82 y.o.   MRN: [id number]   Jennye MoccasinHughes, Styles Fambro Craig 04/14/2015, 4:53 PM
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
## 2015-04-14 NOTE — Progress Notes (Signed)
    SUBJECTIVE:  Confused but he denies any chest pain or SOB.    PHYSICAL EXAM Filed Vitals:   04/14/15 0500 04/14/15 0600 04/14/15 0700 04/14/15 0800  BP: 144/126 130/61 127/82 127/79  Pulse: 139 136 139 120  Temp:      TempSrc:      Resp:  25 23 23   Height:      Weight:      SpO2: 91% 94% 99% 100%   General:  No distress  Lungs:  Clear Heart:  Tachy, murmur as noted.  Abdomen:  Positive bowel sounds, no rebound no guarding Extremities:  No edema  LABS: No results found for: TROPONINI Results for orders placed or performed during the hospital encounter of 04/12/15 (from the past 24 hour(s))  Provider-confirm verbal Blood Bank order - RBC, FFP, Type & Screen; 2 Units; Order taken: 04/12/2015; 1:50 AM; Level 1 Trauma, Emergency Release 2 units each rbc and ffp em released to er, all 4 units returned at 0257     Status: None   Collection Time: 04/13/15 11:58 AM  Result Value Ref Range   Blood product order confirm MD AUTHORIZATION REQUESTED   Prepare RBC (crossmatch)     Status: None   Collection Time: 04/13/15  4:03 PM  Result Value Ref Range   Order Confirmation ORDER PROCESSED BY BLOOD BANK   CBC     Status: Abnormal   Collection Time: 04/14/15  3:55 AM  Result Value Ref Range   WBC 17.0 (H) 4.0 - 10.5 K/uL   RBC 2.95 (L) 4.22 - 5.81 MIL/uL   Hemoglobin 9.2 (L) 13.0 - 17.0 g/dL   HCT 16.127.3 (L) 09.639.0 - 04.552.0 %   MCV 92.5 78.0 - 100.0 fL   MCH 31.2 26.0 - 34.0 pg   MCHC 33.7 30.0 - 36.0 g/dL   RDW 40.912.8 81.111.5 - 91.415.5 %   Platelets 104 (L) 150 - 400 K/uL    Intake/Output Summary (Last 24 hours) at 04/14/15 [id number] Last data filed at 04/14/15 0800  Gross per 24 hour  Intake   1800 ml  Output   1500 ml  Net    300 ml    ECHO:    - Procedure narrative: Transthoracic echocardiography. Image quality was poor. The study was technically difficult, as a result of restricted patient mobility. - Left ventricle: The cavity size was normal. There was moderate concentric  hypertrophy. Systolic function was normal. The estimated ejection fraction was in the range of 60% to 65%. Images were inadequate for LV wall motion assessment. The study is not technically sufficient to allow evaluation of LV diastolic function. - Aortic valve: Moderately calcified with severe aortic stenosis. AVA around 0.9-1 cm2. There was no significant regurgitation. Mean gradient (S): 33 mm Hg. Peak gradient (S): 53 mm Hg. Valve area (Vmax): 1.1 cm^2. Valve area (Vmean): 0.93 cm^2. - Mitral valve: Calcified annulus. There was trivial regurgitation. - Left atrium: The atrium was normal in size. - Inferior vena cava: The vessel was normal in size. The respirophasic diameter changes were in the normal range (>= 50%), consistent with normal central venous pressure.  ASSESSMENT AND PLAN:  Preop:  No obvious contraindications to surgery.  He does have severe but not critical AS.  Watch volume.  I need to see an ECG before surgery (I don't see one in the system.)  He appears to be in sinus tachycardia.     Fayrene FearingJames Fort [facility] Indian Hospitalochrein 04/14/2015 8:28 AM

## 2015-04-14 NOTE — Progress Notes (Signed)
ANTIBIOTIC CONSULT NOTE  Pharmacy Consult for vancomycin / cefepime Indication: surgical prophylaxis / possible PNA  Allergies  Allergen Reactions  . Bee Venom Anaphylaxis    Allergic reaction-anaphylaxis? Has epipen  . Levaquin [Levofloxacin In D5w] Rash  . Levofloxacin Rash  . Lipitor [Atorvastatin] Other (See Comments)  . Penicillins Hives  . Zithromax [Azithromycin] Other (See Comments)    Patient Measurements: Height: 5\' 11"  (180.3 cm) Weight: 194 lb 14.2 oz (88.4 kg) IBW/kg (Calculated) : 75.3 Adjusted Body Weight:   Vital Signs: Temp: 97 F (36.1 C) (01/03 1405) Temp Source: Axillary (01/03 0800) BP: 98/78 mmHg (01/03 1335) Pulse Rate: 130 (01/03 1000) Intake/Output from previous day: 01/02 0701 - 01/03 0700 In: 1800 [P.O.:75; I.V.:1725] Out: 1500 [Urine:1500] Intake/Output from this shift: Total I/O In: 2595 [I.V.:1525; Blood:820; IV Piggyback:250] Out: 1570 [Urine:920; Blood:650]  Labs:  Recent Labs  04/12/15 0159 04/12/15 0207 04/13/15 0430 04/14/15 0355 04/14/15 1102 04/14/15 1159  WBC 17.4*  --  15.8* 17.0*  --   --   HGB 12.6* 13.3 9.0* 9.2* 7.5* 7.1*  PLT 201  --  111* 104*  --   --   CREATININE 2.02* 1.80* 1.56*  --   --   --    Estimated Creatinine Clearance: 40.9 mL/min (by C-G formula based on Cr of 1.56). No results for input(s): VANCOTROUGH, VANCOPEAK, VANCORANDOM, GENTTROUGH, GENTPEAK, GENTRANDOM, TOBRATROUGH, TOBRAPEAK, TOBRARND, AMIKACINPEAK, AMIKACINTROU, AMIKACIN in the last 72 hours.   Microbiology: Recent Results (from the past 720 hour(s))  MRSA PCR Screening     Status: None   Collection Time: 04/12/15  7:04 AM  Result Value Ref Range Status   MRSA by PCR NEGATIVE NEGATIVE Final    Comment:        The GeneXpert MRSA Assay (FDA approved for NASAL specimens only), is one component of a comprehensive MRSA colonization surveillance program. It is not intended to diagnose MRSA infection nor to guide or monitor treatment  for MRSA infections.     Medical History: Past Medical History  Diagnosis Date  . Mitral valve regurgitation   . Carotid stenosis, non-symptomatic   . Restless leg syndrome   . Peripheral neuropathy (HCC)   . Irregular heart rate   . Renal disorder     Assessment: [age] yom s/p spinal surgery. Pharmacy consulted to dose vancomycin for surgical prophylaxis. Patient had drain placed so will continue vancomycin, to start 12 hours post-op. Also on cefepime for possible PNA. Tmax/24h 100.2, wbc increased to 17, LA up to 4.CrCl~40. Received Vanc 1g dose at 1045 this AM  1/2 cefepime>> 1/3 Vanc>>  1/3 resp cx>>  Goal of Therapy:  Vancomycin trough level 15-20 mcg/ml  Plan:  Vanc 750mg  IV q12h - start 12 hours post-op per protocol Cefepime 2g IV q24h Monitor clinical progress, c/s, renal function, abx plan/LOT VT@SS  as indicated   Babs BertinHaley Etha Stambaugh, PharmD, [facility] HospitalBCPS Clinical Pharmacist Pager [phone] 04/14/2015 3:03 PM'

## 2015-04-14 NOTE — Anesthesia Postprocedure Evaluation (Signed)
Anesthesia Post Note  Patient: Ervin KnackRobert Pottenger  Procedure(s) Performed: Procedure(s) (LRB): POSTERIOR LUMBAR FUSION LUMBAR ONE TO LUMBAR FIVE LUMBAR;  DECOMPRESSION LAMINECTOMY LUMBAR TWO-LUMBAR FOUR (N/A)  Patient location during evaluation: PACU Anesthesia Type: General Level of consciousness: awake and alert Pain management: pain level controlled Vital Signs Assessment: post-procedure vital signs reviewed and stable Respiratory status: spontaneous breathing, nonlabored ventilation, respiratory function stable and patient connected to nasal cannula oxygen Cardiovascular status: blood pressure returned to baseline and stable Postop Assessment: no signs of nausea or vomiting Anesthetic complications: no Comments: Patient baseline status, moves all four extremeties, opens eyes to voice, BP is baseline with baseline heart rate around 100-120, EKG sinus with some PVCS.Marland Kitchen. Has great pulmonary mechanics for extubation with RR <12/min with TV >600-800 with stable hemodynamics    Last Vitals:  Filed Vitals:   04/14/15 1000 04/14/15 1335  BP: 128/81   Pulse: 130   Temp:  36.4 C  Resp:      Last Pain:  Filed Vitals:   04/14/15 1349  PainSc: 2                  Reino KentJudd, Mckenley Birenbaum J

## 2015-04-14 NOTE — Transfer of Care (Signed)
Immediate Anesthesia Transfer of Care Note  Patient: Ian KnackRobert Marks  Procedure(s) Performed: Procedure(s): POSTERIOR LUMBAR FUSION LUMBAR ONE TO LUMBAR FIVE LUMBAR;  DECOMPRESSION LAMINECTOMY LUMBAR TWO-LUMBAR FOUR (N/A)  Patient Location: PACU  Anesthesia Type:General  Level of Consciousness: awake and alert   Airway & Oxygen Therapy: Patient Spontanous Breathing and Patient connected to face mask oxygen  Post-op Assessment: Report given to RN, Post -op Vital signs reviewed and stable and Patient moving all extremities X 4  Post vital signs: Reviewed and stable  Last Vitals:  Filed Vitals:   04/14/15 1000 04/14/15 1335  BP: 128/81   Pulse: 130   Temp:  36.4 C  Resp:      Complications: No apparent anesthesia complications

## 2015-04-14 NOTE — Progress Notes (Signed)
Pt to OR. VSS. MD at bedside.

## 2015-04-15 ENCOUNTER — Inpatient Hospital Stay (HOSPITAL_COMMUNITY): Payer: No Typology Code available for payment source

## 2015-04-15 ENCOUNTER — Encounter (HOSPITAL_COMMUNITY): Payer: Self-pay | Admitting: Neurosurgery

## 2015-04-15 LAB — CBC
HCT: 23.1 % — ABNORMAL LOW (ref 39.0–52.0)
HEMOGLOBIN: 7.9 g/dL — AB (ref 13.0–17.0)
MCH: 30.3 pg (ref 26.0–34.0)
MCHC: 34.2 g/dL (ref 30.0–36.0)
MCV: 88.5 fL (ref 78.0–100.0)
PLATELETS: 87 10*3/uL — AB (ref 150–400)
RBC: 2.61 MIL/uL — AB (ref 4.22–5.81)
RDW: 15.2 % (ref 11.5–15.5)
WBC: 13.8 10*3/uL — AB (ref 4.0–10.5)

## 2015-04-15 LAB — TYPE AND SCREEN
ABO/RH(D): A POS
Antibody Screen: NEGATIVE
UNIT DIVISION: 0
UNIT DIVISION: 0
UNIT DIVISION: 0
UNIT DIVISION: 0

## 2015-04-15 LAB — BASIC METABOLIC PANEL
ANION GAP: 8 (ref 5–15)
BUN: 35 mg/dL — ABNORMAL HIGH (ref 6–20)
CHLORIDE: 117 mmol/L — AB (ref 101–111)
CO2: 19 mmol/L — ABNORMAL LOW (ref 22–32)
Calcium: 8.3 mg/dL — ABNORMAL LOW (ref 8.9–10.3)
Creatinine, Ser: 2.12 mg/dL — ABNORMAL HIGH (ref 0.61–1.24)
GFR calc Af Amer: 32 mL/min — ABNORMAL LOW (ref >60)
GFR, EST NON AFRICAN AMERICAN: 28 mL/min — AB (ref >60)
Glucose, Bld: 150 mg/dL — ABNORMAL HIGH (ref 65–99)
POTASSIUM: 4.4 mmol/L (ref 3.5–5.1)
SODIUM: 144 mmol/L (ref 135–145)

## 2015-04-15 LAB — POCT I-STAT 3, ART BLOOD GAS (G3+)
Acid-base deficit: 4 mmol/L — ABNORMAL HIGH (ref 0.0–2.0)
Bicarbonate: 18.6 meq/L — ABNORMAL LOW (ref 20.0–24.0)
O2 Saturation: 92 %
PCO2 ART: 25.2 mmHg — AB (ref 35.0–45.0)
PH ART: 7.478 — AB (ref 7.350–7.450)
PO2 ART: 58 mmHg — AB (ref 80.0–100.0)
Patient temperature: 99.2
TCO2: 19 mmol/L (ref 0–100)

## 2015-04-15 MED ORDER — METOPROLOL TARTRATE 1 MG/ML IV SOLN
5.0000 mg | Freq: Four times a day (QID) | INTRAVENOUS | Status: DC
  Administered 2015-04-15 – 2015-04-23 (×20): 5 mg via INTRAVENOUS
  Filled 2015-04-15 (×23): qty 5

## 2015-04-15 MED ORDER — PANTOPRAZOLE SODIUM 40 MG IV SOLR
40.0000 mg | Freq: Two times a day (BID) | INTRAVENOUS | Status: DC
  Administered 2015-04-15 – 2015-05-20 (×70): 40 mg via INTRAVENOUS
  Filled 2015-04-15 (×70): qty 40

## 2015-04-15 NOTE — Progress Notes (Signed)
RN went into assess patient and found the hemovac disconnected from the tubing. RN attempted to reconnect but the hemovac would no longer hold a charge. Neurosurgery MD notified of such change and was given orders to pull out the tubing entirely. RN will continue to monitor patient closely.

## 2015-04-15 NOTE — Progress Notes (Signed)
    SUBJECTIVE:  More confused today   PHYSICAL EXAM Filed Vitals:   04/15/15 1100 04/15/15 1130 04/15/15 1200 04/15/15 1300  BP: 102/58 102/56 108/63 84/59  Pulse: 114 115  104  Temp:   99.2 F (37.3 C)   TempSrc:   Axillary   Resp: 22 23  22   Height:      Weight:      SpO2: 95% 96%  99%   General:  No distress  Lungs:  Clear Heart:  Tachy, murmur as noted.  Abdomen:  Positive bowel sounds, no rebound no guarding Extremities:  No edema  LABS: No results found for: TROPONINI Results for orders placed or performed during the hospital encounter of 04/12/15 (from the past 24 hour(s))  CBC     Status: Abnormal   Collection Time: 04/15/15  3:11 AM  Result Value Ref Range   WBC 13.8 (H) 4.0 - 10.5 K/uL   RBC 2.61 (L) 4.22 - 5.81 MIL/uL   Hemoglobin 7.9 (L) 13.0 - 17.0 g/dL   HCT 16.123.1 (L) 09.639.0 - 04.552.0 %   MCV 88.5 78.0 - 100.0 fL   MCH 30.3 26.0 - 34.0 pg   MCHC 34.2 30.0 - 36.0 g/dL   RDW 40.915.2 81.111.5 - 91.415.5 %   Platelets 87 (L) 150 - 400 K/uL  Basic metabolic panel     Status: Abnormal   Collection Time: 04/15/15  3:11 AM  Result Value Ref Range   Sodium 144 135 - 145 mmol/L   Potassium 4.4 3.5 - 5.1 mmol/L   Chloride 117 (H) 101 - 111 mmol/L   CO2 19 (L) 22 - 32 mmol/L   Glucose, Bld 150 (H) 65 - 99 mg/dL   BUN 35 (H) 6 - 20 mg/dL   Creatinine, Ser 7.822.12 (H) 0.61 - 1.24 mg/dL   Calcium 8.3 (L) 8.9 - 10.3 mg/dL   GFR calc non Af Amer 28 (L) >60 mL/min   GFR calc Af Amer 32 (L) >60 mL/min   Anion gap 8 5 - 15  I-STAT 3, arterial blood gas (G3+)     Status: Abnormal   Collection Time: 04/15/15  1:29 PM  Result Value Ref Range   pH, Arterial 7.478 (H) 7.350 - 7.450   pCO2 arterial 25.2 (L) 35.0 - 45.0 mmHg   pO2, Arterial 58.0 (L) 80.0 - 100.0 mmHg   Bicarbonate 18.6 (L) 20.0 - 24.0 mEq/L   TCO2 19 0 - 100 mmol/L   O2 Saturation 92.0 %   Acid-base deficit 4.0 (H) 0.0 - 2.0 mmol/L   Patient temperature 99.2 F    Collection site RADIAL, ALLEN'S TEST ACCEPTABLE    Drawn by RT    Sample type ARTERIAL     Intake/Output Summary (Last 24 hours) at 04/15/15 1408 Last data filed at 04/15/15 0600  Gross per 24 hour  Intake   1350 ml  Output    810 ml  Net    540 ml    ASSESSMENT AND PLAN:  Postop:    History of AS and CAD.  He has had no obvious cardiac issues.  Telemetry NSR, sinus tachycardia, and PVCs.    No further cardiac recommendations.  Please call us with further questions.     [name] 04/15/2015 2:08 PM
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
## 2015-04-15 NOTE — Progress Notes (Signed)
Inpatient Rehabilitation  We received a prescreen request from PT/OT for possible CIR .  At this time, we are recommending IP Rehab consult to help determine best plan of care for pt's rehab recovery. Please order if you are in agreement.    Weldon PickingSusan Cora Stetson PT Inpatient Rehab Admissions Coordinator Cell [phone] Office [phone]
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
## 2015-04-15 NOTE — Progress Notes (Signed)
Postop day 1. Patient moderately confused and somewhat agitated. He will awaken and answer simple questions. He will follow commands.  Low-grade temperature elevation. Heart rate remains elevated. Acceptable. Hematocrit 23.1. Creatinine elevated to 2.2. 1.56 preop. Urine output good.  Awake and aware. Confused. Answer simple questions. Motor examination intact in both upper and lower extremities. Dressing clean and dry. Abdomen soft.  Status post lumbar decompression and fusion. Begin efforts at mobilization.

## 2015-04-15 NOTE — Progress Notes (Signed)
RN pulled out hemovac and tubing per Dr. Conchita ParisNundkumar with two other RNs present at bedside. Pressure dressing applied. RN will continue to monitor patient closely.

## 2015-04-16 DIAGNOSIS — S069X3S Unspecified intracranial injury with loss of consciousness of 1 hour to 5 hours 59 minutes, sequela: Secondary | ICD-10-CM

## 2015-04-16 DIAGNOSIS — S32039S Unspecified fracture of third lumbar vertebra, sequela: Secondary | ICD-10-CM

## 2015-04-16 LAB — BASIC METABOLIC PANEL
Anion gap: 7 (ref 5–15)
Anion gap: 7 (ref 5–15)
BUN: 36 mg/dL — AB (ref 6–20)
BUN: 31 mg/dL — AB (ref 6–20)
CALCIUM: 8.5 mg/dL — AB (ref 8.9–10.3)
CHLORIDE: 121 mmol/L — AB (ref 101–111)
CO2: 21 mmol/L — ABNORMAL LOW (ref 22–32)
CO2: 22 mmol/L (ref 22–32)
CREATININE: 1.5 mg/dL — AB (ref 0.61–1.24)
Calcium: 8.5 mg/dL — ABNORMAL LOW (ref 8.9–10.3)
Chloride: 121 mmol/L — ABNORMAL HIGH (ref 101–111)
Creatinine, Ser: 1.71 mg/dL — ABNORMAL HIGH (ref 0.61–1.24)
GFR calc Af Amer: 42 mL/min — ABNORMAL LOW (ref >60)
GFR calc Af Amer: 49 mL/min — ABNORMAL LOW (ref >60)
GFR calc non Af Amer: 36 mL/min — ABNORMAL LOW (ref >60)
GFR, EST NON AFRICAN AMERICAN: 43 mL/min — AB (ref >60)
GLUCOSE: 153 mg/dL — AB (ref 65–99)
GLUCOSE: 139 mg/dL — AB (ref 65–99)
POTASSIUM: 5 mmol/L (ref 3.5–5.1)
Potassium: 4.6 mmol/L (ref 3.5–5.1)
SODIUM: 150 mmol/L — AB (ref 135–145)
Sodium: 149 mmol/L — ABNORMAL HIGH (ref 135–145)

## 2015-04-16 LAB — CBC
HCT: 22.6 % — ABNORMAL LOW (ref 39.0–52.0)
HEMOGLOBIN: 7.4 g/dL — AB (ref 13.0–17.0)
MCH: 30.1 pg (ref 26.0–34.0)
MCHC: 32.7 g/dL (ref 30.0–36.0)
MCV: 91.9 fL (ref 78.0–100.0)
Platelets: 98 10*3/uL — ABNORMAL LOW (ref 150–400)
RBC: 2.46 MIL/uL — AB (ref 4.22–5.81)
RDW: 15.1 % (ref 11.5–15.5)
WBC: 14.5 10*3/uL — ABNORMAL HIGH (ref 4.0–10.5)

## 2015-04-16 MED ORDER — IPRATROPIUM-ALBUTEROL 0.5-2.5 (3) MG/3ML IN SOLN
3.0000 mL | RESPIRATORY_TRACT | Status: DC | PRN
  Administered 2015-04-16 – 2015-05-19 (×7): 3 mL via RESPIRATORY_TRACT
  Filled 2015-04-16 (×7): qty 3

## 2015-04-16 MED ORDER — WHITE PETROLATUM GEL
Status: AC
  Administered 2015-04-16: 0.2
  Filled 2015-04-16: qty 1

## 2015-04-16 NOTE — Progress Notes (Signed)
No new issues or problems overnight. He remains somewhat confused and agitated but will answer some simple questions and follow commands bilaterally.  Currently afebrile. Vitals are stable. Hematocrit 22.6. Creatinine down to 1.7. Awake and aware. Moderately confused and agitated. Follows commands bilaterally. Motor and sensory exam appear intact. Patient notes back pain but no radicular pain. Wound dressing clean and dry.  Progressing slowly. Continue efforts at mobilization.
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
## 2015-04-16 NOTE — Care Management Important Message (Signed)
Important Message  Patient Details  Name: Ian KnackRobert Marks MRN: [id number] Date of Birth: Dec 12, 1933   Medicare Important Message Given:  Yes    Ardra Kuznicki P Berda Shelvin 04/16/2015, 2:26 PM

## 2015-04-16 NOTE — Progress Notes (Signed)
RT note-Called to patient room for inability to cough with airway obstructed. NTS several times for thick tan. BMV to help keep sp02 greater than 90% breifly. NTS with 2 cc NS and large brown plug removed. Nebulizer given at this time as well. BBS equal, placed back to 4l/min [place], with sp02 now 99%.
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
## 2015-04-16 NOTE — Progress Notes (Signed)
PT Cancellation Note  Patient Details Name: Ian KnackRobert Marks MRN: [id number] DOB: 06-Feb-1934   Cancelled Treatment:    Reason Eval/Treat Not Completed: Pain limiting ability to participate.  Pt in terrible pain with simple repositioning.  RN gave IV pain meds and now pt is lethargic and difficult to arouse.  Will hold PT at this time and f/u as appropriate.     Sunny SchleinRitenour, Annlee Glandon F, South CarolinaPT [phone] 04/16/2015, 9:54 AM

## 2015-04-16 NOTE — Progress Notes (Signed)
Rehab admissions - Evaluated for possible admission.  Please see rehab consult recommending SNF.  Patient and wife both plan SNF and family cannot do 24/7 asssistance.  It is the family's preference that patient goes to SNF with his wife.  Call me for questions.  #[phone]
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
## 2015-04-17 ENCOUNTER — Inpatient Hospital Stay (HOSPITAL_COMMUNITY): Payer: No Typology Code available for payment source

## 2015-04-17 LAB — BASIC METABOLIC PANEL
Anion gap: 11 (ref 5–15)
BUN: 32 mg/dL — AB (ref 6–20)
CHLORIDE: 124 mmol/L — AB (ref 101–111)
CO2: 16 mmol/L — AB (ref 22–32)
Calcium: 8.4 mg/dL — ABNORMAL LOW (ref 8.9–10.3)
Creatinine, Ser: 1.43 mg/dL — ABNORMAL HIGH (ref 0.61–1.24)
GFR calc Af Amer: 52 mL/min — ABNORMAL LOW (ref >60)
GFR calc non Af Amer: 45 mL/min — ABNORMAL LOW (ref >60)
Glucose, Bld: 124 mg/dL — ABNORMAL HIGH (ref 65–99)
POTASSIUM: 4.7 mmol/L (ref 3.5–5.1)
SODIUM: 151 mmol/L — AB (ref 135–145)

## 2015-04-17 LAB — CBC
HEMATOCRIT: 22.7 % — AB (ref 39.0–52.0)
HEMOGLOBIN: 7.4 g/dL — AB (ref 13.0–17.0)
MCH: 30.3 pg (ref 26.0–34.0)
MCHC: 32.6 g/dL (ref 30.0–36.0)
MCV: 93 fL (ref 78.0–100.0)
Platelets: 120 10*3/uL — ABNORMAL LOW (ref 150–400)
RBC: 2.44 MIL/uL — AB (ref 4.22–5.81)
RDW: 15 % (ref 11.5–15.5)
WBC: 13.3 10*3/uL — ABNORMAL HIGH (ref 4.0–10.5)

## 2015-04-17 LAB — GLUCOSE, CAPILLARY
GLUCOSE-CAPILLARY: 106 mg/dL — AB (ref 65–99)
GLUCOSE-CAPILLARY: 112 mg/dL — AB (ref 65–99)

## 2015-04-17 MED ORDER — PIVOT 1.5 CAL PO LIQD
1000.0000 mL | ORAL | Status: DC
  Administered 2015-04-17 – 2015-04-19 (×3): 1000 mL
  Filled 2015-04-17 (×8): qty 1000

## 2015-04-17 MED ORDER — POTASSIUM CHLORIDE IN NACL 20-0.45 MEQ/L-% IV SOLN
INTRAVENOUS | Status: DC
  Administered 2015-04-17 – 2015-04-18 (×2): via INTRAVENOUS
  Administered 2015-04-18: 75 mL/h via INTRAVENOUS
  Administered 2015-04-21: 10:00:00 via INTRAVENOUS
  Administered 2015-04-21 (×2): 1000 mL via INTRAVENOUS
  Administered 2015-04-22 – 2015-04-23 (×2): via INTRAVENOUS
  Filled 2015-04-17 (×13): qty 1000
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
## 2015-04-17 NOTE — Progress Notes (Signed)
Looks a little better. Less confused. Able to participate with therapy today. Pain reasonably well-controlled.   Low-grade fevers. Vitals otherwise stable. Neurologically awake and aware. Mildly confused. Follows commands with all 4 extremities. Dressing clean and dry.  Progressing slowly. Continue current efforts. No new recommendations.
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
## 2015-04-17 NOTE — Clinical Social Work Placement (Signed)
   CLINICAL SOCIAL WORK PLACEMENT  NOTE  Date:  04/17/2015  Patient Details  Name: Ervin KnackRobert Diep MRN: [id number] Date of Birth: September 29, 1933  Clinical Social Work is seeking post-discharge placement for this patient at the Skilled  Nursing Facility level of care (*CSW will initial, date and re-position this form in  chart as items are completed):  Yes   Patient/family provided with [facility] Clinical Social Work Department's list of facilities offering this level of care within the geographic area requested by the patient (or if unable, by the patient's family).  Yes   Patient/family informed of their freedom to choose among providers that offer the needed level of care, that participate in Medicare, Medicaid or managed care program needed by the patient, have an available bed and are willing to accept the patient.  Yes   Patient/family informed of [facility]'s ownership interest in [facility] and [facility], as well as of the fact that they are under no obligation to receive care at these facilities.  PASRR submitted to EDS on 04/16/15     PASRR number received on 04/16/15     Existing PASRR number confirmed on       FL2 transmitted to all facilities in geographic area requested by pt/family on 04/17/15     FL2 transmitted to all facilities within larger geographic area on       Patient informed that his/her managed care company has contracts with or will negotiate with certain facilities, including the following:            Patient/family informed of bed offers received.  Patient chooses bed at       Physician recommends and patient chooses bed at      Patient to be transferred to   on  .  Patient to be transferred to facility by       Patient family notified on   of transfer.  Name of family member notified:        PHYSICIAN Please sign FL2     Additional Comment:    Macario GoldsJesse Neyda [place]ango, LCSW [phone]
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
## 2015-04-17 NOTE — Progress Notes (Signed)
Trauma Service Note  Subjective: Patient mumbling a few words.  Did not do well with the last swallowing evaluation.  Objective: Vital signs in last 24 hours: Temp:  [98.9 F (37.2 C)-101.5 F (38.6 C)] 98.9 F (37.2 C) (01/06 0800) Pulse Rate:  [104-123] 112 (01/06 1000) Resp:  [12-24] 24 (01/06 1000) BP: (97-137)/(48-118) 98/64 mmHg (01/06 0800) SpO2:  [94 %-100 %] 99 % (01/06 1000) Last BM Date: 04/13/15  Intake/Output from previous day: 01/05 0701 - 01/06 0700 In: 1850 [I.V.:1800; IV Piggyback:50] Out: 1995 [Urine:1995] Intake/Output this shift: Total I/O In: 278 [I.V.:228; IV Piggyback:50] Out: 405 [Urine:405]  General: Mumbling.  Does not appear to be in distress.  Lungs: Clear to auscultation  Abd: Soft, good bowel sounds.  Not eating  Extremities: No changes  Neuro: Still does not appear to be oriented, but not as agitated as two days. Ago.  Lab Results: CBC   Recent Labs  04/16/15 0305 04/17/15 0522  WBC 14.5* 13.3*  HGB 7.4* 7.4*  HCT 22.6* 22.7*  PLT 98* 120*   BMET  Recent Labs  04/16/15 1938 04/17/15 0522  NA 150* 151*  K 4.6 4.7  CL 121* 124*  CO2 22 16*  GLUCOSE 139* 124*  BUN 31* 32*  CREATININE 1.50* 1.43*  CALCIUM 8.5* 8.4*   PT/INR No results for input(s): LABPROT, INR in the last 72 hours. ABG  Recent Labs  04/14/15 1159 04/15/15 1329  PHART 7.322* 7.478*  HCO3 20.8 18.6*    Studies/Results: Ct Head Wo Contrast  04/15/2015  CLINICAL DATA:  Postop day 1 lumbar fusion. Altered mental status, confusion and agitation. EXAM: CT HEAD WITHOUT CONTRAST TECHNIQUE: Contiguous axial images were obtained from the base of the skull through the vertex without intravenous contrast. COMPARISON:  04/12/2015 FINDINGS: Ventricles, cisterns and other CSF spaces are within normal. There is no mass, mass effect, shift of midline structures or acute hemorrhage. There is no evidence of acute infarction. Remaining bones and soft tissues are  within normal. IMPRESSION: No acute intracranial findings. Electronically Signed   By: Elberta Fortisaniel  Boyle M.D.   On: 04/15/2015 17:12    Anti-infectives: Anti-infectives    Start     Dose/Rate Route Frequency Ordered Stop   04/14/15 2230  vancomycin (VANCOCIN) IVPB 750 mg/150 ml premix  Status:  Discontinued     750 mg 150 mL/hr over 60 Minutes Intravenous Every 12 hours 04/14/15 1508 04/15/15 1130   04/14/15 1338  vancomycin (VANCOCIN) 1000 MG powder  Status:  Discontinued    Comments:  Ratcliff, Esther   : cabinet override      04/14/15 1338 04/14/15 1343   04/14/15 1213  vancomycin (VANCOCIN) powder  Status:  Discontinued       As needed 04/14/15 1214 04/14/15 1333   04/14/15 1211  vancomycin (VANCOCIN) 1000 MG powder    Comments:  Lannie Fieldsatcliff, Esther   : cabinet override      04/14/15 1211 04/15/15 0014   04/14/15 1147  bacitracin 50,000 Units in sodium chloride irrigation 0.9 % 500 mL irrigation  Status:  Discontinued       As needed 04/14/15 1147 04/14/15 1333   04/14/15 0600  vancomycin (VANCOCIN) IVPB 1000 mg/200 mL premix     1,000 mg 200 mL/hr over 60 Minutes Intravenous To Neuro OR-Station #32 04/13/15 1602 04/14/15 1145   04/13/15 1100  ceFEPIme (MAXIPIME) 2 g in dextrose 5 % 50 mL IVPB     2 g 100 mL/hr over 30 Minutes Intravenous  Every 24 hours 04/13/15 1017     04/12/15 0445  ceFAZolin (ANCEF) IVPB 1 g/50 mL premix     1 g 100 mL/hr over 30 Minutes Intravenous  Once 04/12/15 0430 04/12/15 0535      Assessment/Plan: s/p Procedure(s): POSTERIOR LUMBAR FUSION LUMBAR ONE TO LUMBAR FIVE LUMBAR;  DECOMPRESSION LAMINECTOMY LUMBAR TWO-LUMBAR FOUR Start tube feedings.Continue to work with therapies.  LOS: 5 days   Marta Lamas. Gae Bon, MD, FACS [phone] Trauma Surgeon 04/17/2015
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
## 2015-04-18 LAB — GLUCOSE, CAPILLARY
GLUCOSE-CAPILLARY: 123 mg/dL — AB (ref 65–99)
GLUCOSE-CAPILLARY: 124 mg/dL — AB (ref 65–99)
GLUCOSE-CAPILLARY: 128 mg/dL — AB (ref 65–99)
GLUCOSE-CAPILLARY: 149 mg/dL — AB (ref 65–99)
Glucose-Capillary: 133 mg/dL — ABNORMAL HIGH (ref 65–99)
Glucose-Capillary: 120 mg/dL — ABNORMAL HIGH (ref 65–99)

## 2015-04-18 LAB — CBC WITH DIFFERENTIAL/PLATELET
BASOS ABS: 0 10*3/uL (ref 0.0–0.1)
BASOS PCT: 0 %
EOS ABS: 0.1 10*3/uL (ref 0.0–0.7)
Eosinophils Relative: 1 %
HCT: 24 % — ABNORMAL LOW (ref 39.0–52.0)
HEMOGLOBIN: 7.6 g/dL — AB (ref 13.0–17.0)
Lymphocytes Relative: 9 %
Lymphs Abs: 1.1 10*3/uL (ref 0.7–4.0)
MCH: 29.9 pg (ref 26.0–34.0)
MCHC: 31.7 g/dL (ref 30.0–36.0)
MCV: 94.5 fL (ref 78.0–100.0)
Monocytes Absolute: 1.2 10*3/uL — ABNORMAL HIGH (ref 0.1–1.0)
Monocytes Relative: 10 %
NEUTROS ABS: 9.6 10*3/uL — AB (ref 1.7–7.7)
NEUTROS PCT: 80 %
Platelets: 155 10*3/uL (ref 150–400)
RBC: 2.54 MIL/uL — AB (ref 4.22–5.81)
RDW: 15.3 % (ref 11.5–15.5)
WBC: 11.9 10*3/uL — AB (ref 4.0–10.5)

## 2015-04-18 LAB — BASIC METABOLIC PANEL
Anion gap: 10 (ref 5–15)
BUN: 32 mg/dL — AB (ref 6–20)
CHLORIDE: 122 mmol/L — AB (ref 101–111)
CO2: 20 mmol/L — AB (ref 22–32)
Calcium: 8.5 mg/dL — ABNORMAL LOW (ref 8.9–10.3)
Creatinine, Ser: 1.45 mg/dL — ABNORMAL HIGH (ref 0.61–1.24)
GFR calc Af Amer: 51 mL/min — ABNORMAL LOW (ref >60)
GFR calc non Af Amer: 44 mL/min — ABNORMAL LOW (ref >60)
GLUCOSE: 142 mg/dL — AB (ref 65–99)
POTASSIUM: 4.1 mmol/L (ref 3.5–5.1)
Sodium: 152 mmol/L — ABNORMAL HIGH (ref 135–145)

## 2015-04-18 MED ORDER — IPRATROPIUM-ALBUTEROL 0.5-2.5 (3) MG/3ML IN SOLN
3.0000 mL | Freq: Three times a day (TID) | RESPIRATORY_TRACT | Status: DC
  Administered 2015-04-19 – 2015-04-23 (×13): 3 mL via RESPIRATORY_TRACT
  Filled 2015-04-18 (×12): qty 3

## 2015-04-18 NOTE — Progress Notes (Signed)
No acute events AVSS Moving all extremities well Incision clean, dry, intact Stable Continue current

## 2015-04-18 NOTE — Progress Notes (Signed)
SLP Cancellation Note  Patient Details Name: Ian KnackRobert Marks MRN: [id number] DOB: 1934-01-24   Cancelled treatment:       Reason Eval/Treat Not Completed: Fatigue/lethargy limiting ability to participate.  The patient was just given Vicodin and Haldol.  Left contact information with nurse to contact ST if he becomes more alert today.  Otherwise ST will follow up next day or two.    Dimas AguasMelissa Taviana Westergren, MA, CCC-SLP Acute Rehab SLP [phone] Fleet ContrasGoodman, [name] 04/18/2015, 10:13 AM
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
## 2015-04-19 ENCOUNTER — Inpatient Hospital Stay (HOSPITAL_COMMUNITY): Payer: No Typology Code available for payment source | Admitting: Anesthesiology

## 2015-04-19 ENCOUNTER — Inpatient Hospital Stay (HOSPITAL_COMMUNITY): Payer: No Typology Code available for payment source

## 2015-04-19 LAB — POCT I-STAT 3, ART BLOOD GAS (G3+)
Acid-base deficit: 4 mmol/L — ABNORMAL HIGH (ref 0.0–2.0)
Bicarbonate: 20.9 meq/L (ref 20.0–24.0)
O2 Saturation: 93 %
PCO2 ART: 34.6 mmHg — AB (ref 35.0–45.0)
PH ART: 7.388 (ref 7.350–7.450)
TCO2: 22 mmol/L (ref 0–100)
pO2, Arterial: 69 mmHg — ABNORMAL LOW (ref 80.0–100.0)

## 2015-04-19 LAB — GLUCOSE, CAPILLARY
GLUCOSE-CAPILLARY: 119 mg/dL — AB (ref 65–99)
GLUCOSE-CAPILLARY: 134 mg/dL — AB (ref 65–99)
GLUCOSE-CAPILLARY: 148 mg/dL — AB (ref 65–99)
GLUCOSE-CAPILLARY: 161 mg/dL — AB (ref 65–99)
Glucose-Capillary: 149 mg/dL — ABNORMAL HIGH (ref 65–99)
Glucose-Capillary: 165 mg/dL — ABNORMAL HIGH (ref 65–99)
Glucose-Capillary: 182 mg/dL — ABNORMAL HIGH (ref 65–99)

## 2015-04-19 LAB — BLOOD GAS, ARTERIAL
ACID-BASE DEFICIT: 3.9 mmol/L — AB (ref 0.0–2.0)
Bicarbonate: 20 mEq/L (ref 20.0–24.0)
DRAWN BY: 283401
FIO2: 0.6
LHR: 14 {breaths}/min
MECHVT: 600 mL
O2 Saturation: 99.1 %
PEEP/CPAP: 5 cmH2O
Patient temperature: 98.6
TCO2: 21 mmol/L (ref 0–100)
pCO2 arterial: 32.8 mmHg — ABNORMAL LOW (ref 35.0–45.0)
pH, Arterial: 7.402 (ref 7.350–7.450)
pO2, Arterial: 160 mmHg — ABNORMAL HIGH (ref 80.0–100.0)

## 2015-04-19 LAB — TRIGLYCERIDES: TRIGLYCERIDES: 99 mg/dL (ref <150)

## 2015-04-19 MED ORDER — PROPOFOL 1000 MG/100ML IV EMUL
5.0000 ug/kg/min | INTRAVENOUS | Status: DC
  Administered 2015-04-19: 30 ug/kg/min via INTRAVENOUS
  Administered 2015-04-19: 5 ug/kg/min via INTRAVENOUS
  Administered 2015-04-19 – 2015-04-20 (×2): 30 ug/kg/min via INTRAVENOUS
  Filled 2015-04-19 (×3): qty 100

## 2015-04-19 MED ORDER — CHLORHEXIDINE GLUCONATE 0.12% ORAL RINSE (MEDLINE KIT)
15.0000 mL | Freq: Two times a day (BID) | OROMUCOSAL | Status: DC
Start: 2015-04-19 — End: 2015-05-13
  Administered 2015-04-19 – 2015-05-12 (×45): 15 mL via OROMUCOSAL
  Filled 2015-04-19: qty 15

## 2015-04-19 MED ORDER — SODIUM CHLORIDE 0.9 % IJ SOLN
10.0000 mL | Freq: Two times a day (BID) | INTRAMUSCULAR | Status: DC
  Administered 2015-04-19: 10 mL
  Administered 2015-04-19: 30 mL
  Administered 2015-04-20: 20 mL
  Administered 2015-04-21 – 2015-04-27 (×13): 10 mL
  Administered 2015-04-28: 20 mL
  Administered 2015-04-28 – 2015-05-01 (×6): 10 mL
  Administered 2015-05-02: 20 mL
  Administered 2015-05-03 – 2015-05-04 (×3): 10 mL
  Administered 2015-05-05: 30 mL
  Administered 2015-05-05: 20 mL
  Administered 2015-05-06 – 2015-05-07 (×2): 10 mL
  Administered 2015-05-07 – 2015-05-08 (×3): 20 mL
  Administered 2015-05-08 – 2015-05-09 (×3): 10 mL
  Administered 2015-05-09: 20 mL
  Administered 2015-05-09 – 2015-05-12 (×4): 10 mL
  Administered 2015-05-12: 20 mL
  Administered 2015-05-13 (×2): 10 mL
  Administered 2015-05-13: 30 mL
  Administered 2015-05-14 (×2): 10 mL
  Administered 2015-05-15: 30 mL
  Administered 2015-05-16: 10 mL
  Administered 2015-05-17: 30 mL
  Administered 2015-05-17 – 2015-05-20 (×6): 10 mL

## 2015-04-19 MED ORDER — FENTANYL CITRATE (PF) 100 MCG/2ML IJ SOLN
INTRAMUSCULAR | Status: AC
  Filled 2015-04-19: qty 4

## 2015-04-19 MED ORDER — FUROSEMIDE 10 MG/ML IJ SOLN
20.0000 mg | Freq: Once | INTRAMUSCULAR | Status: AC
  Administered 2015-04-19: 20 mg via INTRAVENOUS
  Filled 2015-04-19: qty 2

## 2015-04-19 MED ORDER — ANTISEPTIC ORAL RINSE SOLUTION (CORINZ)
7.0000 mL | OROMUCOSAL | Status: DC
Start: 2015-04-19 — End: 2015-04-22
  Administered 2015-04-19 – 2015-04-21 (×26): 7 mL via OROMUCOSAL

## 2015-04-19 MED ORDER — SODIUM CHLORIDE 0.9 % IV BOLUS (SEPSIS)
500.0000 mL | Freq: Once | INTRAVENOUS | Status: AC
  Administered 2015-04-19: 500 mL via INTRAVENOUS

## 2015-04-19 MED ORDER — DEXTROSE 5 % IV SOLN
0.0000 ug/min | INTRAVENOUS | Status: DC
  Administered 2015-04-19: 5 ug/min via INTRAVENOUS
  Administered 2015-04-19 – 2015-04-20 (×3): 15 ug/min via INTRAVENOUS
  Filled 2015-04-19 (×6): qty 4

## 2015-04-19 MED ORDER — SODIUM CHLORIDE 0.9 % IJ SOLN
10.0000 mL | INTRAMUSCULAR | Status: DC | PRN
  Administered 2015-04-20: 20 mL
  Filled 2015-04-19: qty 40

## 2015-04-19 MED ORDER — PROPOFOL 1000 MG/100ML IV EMUL
INTRAVENOUS | Status: AC
  Filled 2015-04-19: qty 100

## 2015-04-19 MED ORDER — MIDAZOLAM HCL 2 MG/2ML IJ SOLN
INTRAMUSCULAR | Status: AC
  Filled 2015-04-19: qty 4

## 2015-04-19 NOTE — Progress Notes (Signed)
Peripherally Inserted Central Catheter/Midline Placement  The IV Nurse has discussed with the patient and/or persons authorized to consent for the patient, the purpose of this procedure and the potential benefits and risks involved with this procedure.  The benefits include less needle sticks, lab draws from the catheter and patient may be discharged home with the catheter.  Risks include, but not limited to, infection, bleeding, blood clot (thrombus formation), and puncture of an artery; nerve damage and irregular heat beat.  Alternatives to this procedure were also discussed.  PICC/Midline Placement Documentation  PICC Triple Lumen 04/19/15 PICC Right Basilic 37 cm 0 cm (Active)  Indication for Insertion or Continuance of Line Vasoactive infusions;Prolonged intravenous therapies;Limited venous access - need for IV therapy >5 days (PICC only) 04/19/2015 12:57 PM  Exposed Catheter (cm) 0 cm 04/19/2015 12:57 PM  Site Assessment Clean;Dry;Intact 04/19/2015 12:57 PM  Lumen #1 Status Flushed;Saline locked;Blood return noted 04/19/2015 12:57 PM  Lumen #2 Status Flushed;Saline locked;Blood return noted 04/19/2015 12:57 PM  Lumen #3 Status Flushed;Saline locked;Blood return noted 04/19/2015 12:57 PM  Dressing Type Transparent 04/19/2015 12:57 PM  Dressing Status Clean;Dry;Intact;Antimicrobial disc in place 04/19/2015 12:57 PM  Line Care Connections checked and tightened 04/19/2015 12:57 PM  Line Adjustment (NICU/IV Team Only) No 04/19/2015 12:57 PM  Dressing Intervention New dressing 04/19/2015 12:57 PM  Dressing Change Due 04/26/15 04/19/2015 12:57 PM       Elliot Dallyiggs, Rionna Feltes Wright 04/19/2015, 1:00 PM

## 2015-04-19 NOTE — Progress Notes (Signed)
Patient hypotensive at this time. Dr. Luisa Hartornett aware. Patient received 1 liter bolus of NS, awaiting levophed from pharmacy.

## 2015-04-19 NOTE — Progress Notes (Signed)
SLP Cancellation Note  Patient Details Name: Ian KnackRobert Marks MRN: [id number] DOB: 28-Nov-1933   Cancelled treatment:       Reason Eval/Treat Not Completed: Medical issues which prohibited therapy (Patient intubated. Will f/u 1/9. ).   [name] MA, CCC-SLP [phone]    [name] 04/19/2015, 10:29 AM

## 2015-04-19 NOTE — Progress Notes (Signed)
Pt WOB became much more labored and RR into the 40's with desaturation Pt has thick secretions and was intubated due to declining respiratory status He has some HOTN and pneumonia suspected  On maxipine  Added levophed for BP support and saline bolus  CXR shows good et  Location.

## 2015-04-19 NOTE — Progress Notes (Signed)
Intubated yesterday Moving lower extremities well Incision clean, dry, intact Neurologically stable Continue current care
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
## 2015-04-19 NOTE — Progress Notes (Addendum)
Patient began having increased work of breathing with tachypnea at 40s. Oxygen saturation dropped to 87% on room air, and patient was placed on 3 liters via nasal canula. Dr. Luisa Hartornett made aware. ABG, stat chest x-ray, and lasix ordered. Patient's IV fluid also decreased from 75 ml/hr to 10 ml/hr as directed by Dr. Luisa Hartornett. Patient then proceeded with decreased oxygen saturation, 76% on nasal canula. Patient placed on non-rebreather. Oxygen saturation back up to 98%, but patient not responding to verbal any longer and continues to be tachypnic. Dr. Luisa Hartornett made aware and stated to proceed with intubation. Anesthesia intubated patient. Patient became hypotensive after intubation (received no RSI medication prior to intubation). Patient received 1 Liter of NS bolus and no improvement in blood pressure. Levophed started as directed by Dr. Luisa Hartornett. Will continue to monitor patient.
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
## 2015-04-20 LAB — CBC WITH DIFFERENTIAL/PLATELET
Basophils Absolute: 0 10*3/uL (ref 0.0–0.1)
Basophils Relative: 0 %
EOS ABS: 0.4 10*3/uL (ref 0.0–0.7)
EOS PCT: 3 %
HCT: 24.5 % — ABNORMAL LOW (ref 39.0–52.0)
Hemoglobin: 7.5 g/dL — ABNORMAL LOW (ref 13.0–17.0)
LYMPHS ABS: 1.2 10*3/uL (ref 0.7–4.0)
Lymphocytes Relative: 11 %
MCH: 29.9 pg (ref 26.0–34.0)
MCHC: 30.6 g/dL (ref 30.0–36.0)
MCV: 97.6 fL (ref 78.0–100.0)
MONOS PCT: 7 %
Monocytes Absolute: 0.7 10*3/uL (ref 0.1–1.0)
Neutro Abs: 8.1 10*3/uL — ABNORMAL HIGH (ref 1.7–7.7)
Neutrophils Relative %: 79 %
PLATELETS: 209 10*3/uL (ref 150–400)
RBC: 2.51 MIL/uL — ABNORMAL LOW (ref 4.22–5.81)
RDW: 15.8 % — ABNORMAL HIGH (ref 11.5–15.5)
WBC: 10.4 10*3/uL (ref 4.0–10.5)

## 2015-04-20 LAB — BLOOD GAS, ARTERIAL
Acid-base deficit: 3 mmol/L — ABNORMAL HIGH (ref 0.0–2.0)
Bicarbonate: 20.7 mEq/L (ref 20.0–24.0)
Drawn by: 331761
FIO2: 0.4
MODE: POSITIVE
O2 Saturation: 97.4 %
PEEP/CPAP: 5 cmH2O
Patient temperature: 98.6
Pressure support: 5 cmH2O
TCO2: 21.6 mmol/L (ref 0–100)
pCO2 arterial: 31.8 mmHg — ABNORMAL LOW (ref 35.0–45.0)
pH, Arterial: 7.429 (ref 7.350–7.450)
pO2, Arterial: 95.1 mmHg (ref 80.0–100.0)

## 2015-04-20 LAB — GLUCOSE, CAPILLARY
Glucose-Capillary: 165 mg/dL — ABNORMAL HIGH (ref 65–99)
Glucose-Capillary: 156 mg/dL — ABNORMAL HIGH (ref 65–99)
Glucose-Capillary: 114 mg/dL — ABNORMAL HIGH (ref 65–99)
Glucose-Capillary: 102 mg/dL — ABNORMAL HIGH (ref 65–99)
Glucose-Capillary: 104 mg/dL — ABNORMAL HIGH (ref 65–99)
Glucose-Capillary: 108 mg/dL — ABNORMAL HIGH (ref 65–99)

## 2015-04-20 LAB — BASIC METABOLIC PANEL
ANION GAP: 8 (ref 5–15)
BUN: 38 mg/dL — ABNORMAL HIGH (ref 6–20)
CALCIUM: 8.4 mg/dL — AB (ref 8.9–10.3)
CO2: 24 mmol/L (ref 22–32)
Chloride: 119 mmol/L — ABNORMAL HIGH (ref 101–111)
Creatinine, Ser: 1.46 mg/dL — ABNORMAL HIGH (ref 0.61–1.24)
GFR, EST AFRICAN AMERICAN: 51 mL/min — AB (ref >60)
GFR, EST NON AFRICAN AMERICAN: 44 mL/min — AB (ref >60)
Glucose, Bld: 155 mg/dL — ABNORMAL HIGH (ref 65–99)
POTASSIUM: 4.2 mmol/L (ref 3.5–5.1)
SODIUM: 151 mmol/L — AB (ref 135–145)

## 2015-04-20 MED ORDER — DEXTROSE 5 % IV SOLN
2.0000 g | Freq: Two times a day (BID) | INTRAVENOUS | Status: DC
  Administered 2015-04-20 – 2015-04-21 (×3): 2 g via INTRAVENOUS
  Filled 2015-04-20 (×5): qty 2

## 2015-04-20 NOTE — Procedures (Signed)
Extubation Procedure Note  Patient Details:   Name: Ian KnackRobert Kawamoto DOB: 1934-01-30 MRN: [id number]   Airway Documentation:  Airway (Active)    Evaluation  O2 sats: stable throughout Complications: No apparent complications Patient did tolerate procedure well. Bilateral Breath Sounds: Clear Suctioning: Oral, Airway Yes   Pt. Was extubated to a 3L  without any complications, dyspnea or stridor noted. Pt. Was instructed on IS x 5, highest goal achieved was 500mL.   [name], [name] 04/20/2015, 2:55 PM

## 2015-04-20 NOTE — Progress Notes (Signed)
SLP Cancellation Note  Patient Details Name: Ian KnackRobert Marks MRN: [id number] DOB: 12-01-1933   Cancelled treatment:       Reason Eval/Treat Not Completed: Medical issues which prohibited therapy. Patient remains intubated. Signing off. Please re consult when appropriate.   Ferdinand LangoLeah Dinisha Cai MA, CCC-SLP [phone]    Tnia Anglada Meryl 04/20/2015, 7:50 AM

## 2015-04-20 NOTE — Progress Notes (Signed)
Education gvien to pt's son regarding weaning from vent, extubation, and criteria for staying extubated, turning, mouth care etc. (ventilated path education not crossing over to pt ed tab).

## 2015-04-20 NOTE — Progress Notes (Signed)
Follow up - Trauma and Critical Care  Patient Details:    [name] is an 82 y.o. male.  Lines/tubes : Airway 7.5 mm (Active)  Secured at (cm) 25 cm 04/20/2015  8:03 AM  Measured From Lips 04/20/2015  8:03 AM  Secured Location Left 04/20/2015  8:03 AM  Secured By [employer] 04/20/2015  8:03 AM  Tube Holder Repositioned Yes 04/20/2015  8:03 AM  Cuff Pressure (cm H2O) 24 cm H2O 04/20/2015  3:31 AM  Site Condition Dry 04/20/2015  8:03 AM     Airway (Active)     PICC Triple Lumen 04/19/15 PICC Right Basilic 37 cm 0 cm (Active)  Indication for Insertion or Continuance of Line Vasoactive infusions 04/19/2015  8:00 PM  Exposed Catheter (cm) 0 cm 04/19/2015 12:57 PM  Site Assessment Clean;Dry;Intact 04/19/2015  8:00 PM  Lumen #1 Status Infusing 04/19/2015  8:00 PM  Lumen #2 Status Infusing 04/19/2015  8:00 PM  Lumen #3 Status Capped (Central line) 04/19/2015  8:00 PM  Dressing Type Transparent 04/19/2015  8:00 PM  Dressing Status Clean;Dry;Intact;Antimicrobial disc in place 04/19/2015  8:00 PM  Line Care Connections checked and tightened 04/19/2015  8:00 PM  Line Adjustment (NICU/IV Team Only) No 04/19/2015 12:57 PM  Dressing Intervention New dressing 04/19/2015 12:57 PM  Dressing Change Due 04/26/15 04/19/2015  8:00 PM     External Urinary Catheter (Active)  Collection Container Standard drainage bag 04/19/2015  8:00 PM  Securement Method Securing device (Describe) 04/19/2015  8:00 PM  Output (mL) 45 mL 04/20/2015  6:00 AM    Microbiology/Sepsis markers: Results for orders placed or performed during the hospital encounter of 04/12/15  MRSA PCR Screening     Status: None   Collection Time: 04/12/15  7:04 AM  Result Value Ref Range Status   MRSA by PCR NEGATIVE NEGATIVE Final    Comment:        The GeneXpert MRSA Assay (FDA approved for NASAL specimens only), is one component of a comprehensive MRSA colonization surveillance program. It is not intended to diagnose MRSA infection nor to guide  or monitor treatment for MRSA infections.     Anti-infectives:  Anti-infectives    Start     Dose/Rate Route Frequency Ordered Stop   04/14/15 2230  vancomycin (VANCOCIN) IVPB 750 mg/150 ml premix  Status:  Discontinued     750 mg 150 mL/hr over 60 Minutes Intravenous Every 12 hours 04/14/15 1508 04/15/15 1130   04/14/15 1338  vancomycin (VANCOCIN) 1000 MG powder  Status:  Discontinued    Comments:  Ratcliff, Esther   : cabinet override      04/14/15 1338 04/14/15 1343   04/14/15 1213  vancomycin (VANCOCIN) powder  Status:  Discontinued       As needed 04/14/15 1214 04/14/15 1333   04/14/15 1211  vancomycin (VANCOCIN) 1000 MG powder    Comments:  Lannie Fields, Esther   : cabinet override      04/14/15 1211 04/15/15 0014   04/14/15 1147  bacitracin 50,000 Units in sodium chloride irrigation 0.9 % 500 mL irrigation  Status:  Discontinued       As needed 04/14/15 1147 04/14/15 1333   04/14/15 0600  vancomycin (VANCOCIN) IVPB 1000 mg/200 mL premix     1,000 mg 200 mL/hr over 60 Minutes Intravenous To Neuro OR-Station #32 04/13/15 1602 04/14/15 1145   04/13/15 1100  ceFEPIme (MAXIPIME) 2 g in dextrose 5 % 50 mL IVPB     2 g 100 mL/hr  over 30 Minutes Intravenous Every 24 hours 04/13/15 1017     04/12/15 0445  ceFAZolin (ANCEF) IVPB 1 g/50 mL premix     1 g 100 mL/hr over 30 Minutes Intravenous  Once 04/12/15 0430 04/12/15 0535      Best Practice/Protocols:  VTE Prophylaxis: Lovenox (prophylaxtic dose) and Mechanical GI Prophylaxis: Proton Pump Inhibitor No sedation currently and Levelphed is off  Consults: Treatment Team:  Julio SicksHenry Pool, MD Kathryne Hitchhristopher Y Blackman, MD    Events:  Subjective:    Overnight Issues: Has done well overnight.  Objective:  Vital signs for last 24 hours: Temp:  [98.5 F (36.9 C)-100 F (37.8 C)] 98.5 F (36.9 C) (01/09 0800) Pulse Rate:  [47-107] 100 (01/09 1000) Resp:  [0-27] 24 (01/09 1000) BP: (81-148)/(50-120) 97/68 mmHg (01/09 1000) SpO2:   [99 %-100 %] 100 % (01/09 1000) FiO2 (%):  [40 %-50 %] 40 % (01/09 0803) Weight:  [87.8 kg (193 lb 9 oz)] 87.8 kg (193 lb 9 oz) (01/09 0431)  Hemodynamic parameters for last 24 hours:    Intake/Output from previous day: 01/08 0701 - 01/09 0700 In: 2990.8 [I.V.:1675.8; NG/GT:1265; IV Piggyback:50] Out: 1500 [Urine:1500]  Intake/Output this shift: Total I/O In: -  Out: 250 [Urine:250]  Vent settings for last 24 hours: Vent Mode:  [-] PSV;CPAP FiO2 (%):  [40 %-50 %] 40 % Set Rate:  [14 bmp] 14 bmp Vt Set:  [600 mL] 600 mL PEEP:  [5 cmH20] 5 cmH20 Pressure Support:  [5 cmH20-10 cmH20] 5 cmH20 Plateau Pressure:  [15 cmH20-16 cmH20] 16 cmH20  Physical Exam:  General: alert and no respiratory distress Neuro: alert, oriented and nonfocal exam Resp: clear to auscultation bilaterally CVS: regular rate and rhythm, S1, S2 normal, no murmur, click, rub or gallop GI: soft, nontender, BS WNL, no r/g Extremities: no edema, no erythema, pulses WNL  Results for orders placed or performed during the hospital encounter of 04/12/15 (from the past 24 hour(s))  Glucose, capillary     Status: Abnormal   Collection Time: 04/19/15 11:47 AM  Result Value Ref Range   Glucose-Capillary 161 (H) 65 - 99 mg/dL  Glucose, capillary     Status: Abnormal   Collection Time: 04/19/15  3:43 PM  Result Value Ref Range   Glucose-Capillary 165 (H) 65 - 99 mg/dL  Glucose, capillary     Status: Abnormal   Collection Time: 04/19/15  7:51 PM  Result Value Ref Range   Glucose-Capillary 148 (H) 65 - 99 mg/dL  Glucose, capillary     Status: Abnormal   Collection Time: 04/19/15 11:27 PM  Result Value Ref Range   Glucose-Capillary 182 (H) 65 - 99 mg/dL  Glucose, capillary     Status: Abnormal   Collection Time: 04/20/15  3:46 AM  Result Value Ref Range   Glucose-Capillary 165 (H) 65 - 99 mg/dL  Glucose, capillary     Status: Abnormal   Collection Time: 04/20/15  8:17 AM  Result Value Ref Range    Glucose-Capillary 156 (H) 65 - 99 mg/dL   Comment 1 Notify RN    Comment 2 Document in Chart      Assessment/Plan:   NEURO  Patient is more cooperative now that he is on the ventilator.  No distress   Plan: Keep sedation off.   Wean to extubate.  PULM  Mild atelectasis in the LLL pn CXR done yesterday.   Plan: CPM.  Wean to extubate  CARDIO  No arrhythmias   Plan: CPM  RENAL  Actue Renal Failure (due to hypovolemia/decreased circulating volume) Increased BUn and creatinine.   Plan: Check labs again  GI  No known problems.   Plan: CPM  ID  No known infectious sources.   Plan: CPM  HEME  Anemia acute blood loss anemia and anemia of critical illness)   Plan: No blood for now.  ENDO No known issues.   Plan: CPM  Global Issues  Patient is actually weaning well and could be extubated soon.  I am wondering what caused him to get so bad to require intubation in the first place.  Question of pneumonia, but CXR from yesterday was fairly clear.  Will recheck cultures.    LOS: 8 days   Additional comments:Labs are all pending including an ABG.  Critical Care Total Time*: 30 Minutes  Kelliann Pendergraph 04/20/2015  *Care during the described time interval was provided by me and/or other providers on the critical care team.  I have reviewed this patient's available data, including medical history, events of note, physical examination and test results as part of my evaluation.

## 2015-04-20 NOTE — Progress Notes (Addendum)
Pt pulled cortrack tube out while son was at bedside. Discussed with Dr. Luisa Hartornett. Orders received.
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
## 2015-04-20 NOTE — Progress Notes (Signed)
Patient ID: Ervin KnackRobert Stoll, male   DOB: [birth year], 82 y.o.   MRN: [id number] Incisions and sutures on both feet look good.  Have been in for 9 days.  Will plan on leaving the sutures in for at least 2 total weeks.  Dry dressing daily as needed.  Once he is able to be up, can put full weight on his feet.

## 2015-04-20 NOTE — Clinical Social Work Note (Signed)
Clinical Social Worker continuing to follow patient and family for support and discharge planning needs.  Patient is currently intubated and less responsive.  CSW will continue to follow for family support and to assist with [place] discharge needs to [place] when medically [place].  Macario GoldsJesse Embree Brawley, [place] [phone]
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
## 2015-04-20 NOTE — Progress Notes (Addendum)
Pharmacy Antibiotic Follow-up Note  Assessment/Plan: [name] is a 82 y.o. year-old male admitted on 04/12/2015.  The patient is currently on day #7 of abx for PNA. Last CXR shows improved L basilar airspace opacity but still concern for PNA. Afebrile, WBC down to 11.9. MRSA PCR negative. SCr 1.8 on admit, improved to 1.45, CrCl~40-3645ml/min.   Plan: Continue cefepime 2g IV q24h  Monitor clinical picture, renal function F/U LOT (Consider 7-10 days)   Temp (24hrs), Avg:99.5 F (37.5 C), Min:99.2 F (37.3 C), Max:100 F (37.8 C)   Recent Labs Lab 04/14/15 0355 04/15/15 0311 04/16/15 0305 04/17/15 0522 04/18/15 0237  WBC 17.0* 13.8* 14.5* 13.3* 11.9*    Recent Labs Lab 04/15/15 0311 04/16/15 0305 04/16/15 1938 04/17/15 0522 04/18/15 0237  CREATININE 2.12* 1.71* 1.50* 1.43* 1.45*   Estimated Creatinine Clearance: 44 mL/min (by C-G formula based on Cr of 1.45).    Allergies  Allergen Reactions  . Bee Venom Anaphylaxis    Allergic reaction-anaphylaxis? Has epipen  . Morphine And Related Anxiety    Family requested Morphine be not given because of pt having extreme agitation and anxiety.  [name]. Levaquin [Levofloxacin In D5w] Rash  . Levofloxacin Rash  . Lipitor [Atorvastatin] Other (See Comments)  . Penicillins Hives  . Zithromax [Azithromycin] Other (See Comments)    Antimicrobials this admission: 1/2 Cefepime >> 1/3 Vanc >> 1/4  Microbiology results: 1/3 resp cx >> 1/1 MRSA PCR neg  Thank you for allowing pharmacy to be a part of this patient's care.  Enzo BiNathan Jinnifer Marks, PharmD, BCPS Clinical Pharmacist Pager [phone] 04/20/2015 8:08 AM   ADDENDUM:  Change cefepime to 2g IV Q12 (per Lexi dosing) F/U addition of vanc if needed

## 2015-04-20 NOTE — Progress Notes (Signed)
PT Cancellation Note  Patient Details Name: Ian KnackRobert Ragas MRN: [id number] DOB: 26-Apr-1933   Cancelled Treatment:    Reason Eval/Treat Not Completed: Medical issues which prohibited therapy.  Pt now intubated and less responsive at this time.  Will f/u as appropriate.     Sunny SchleinRitenour, Nubia Ziesmer F, [place] CarolinaPT [phone] 04/20/2015, 11:34 AM

## 2015-04-21 LAB — GLUCOSE, CAPILLARY
GLUCOSE-CAPILLARY: 99 mg/dL (ref 65–99)
Glucose-Capillary: 105 mg/dL — ABNORMAL HIGH (ref 65–99)
Glucose-Capillary: 103 mg/dL — ABNORMAL HIGH (ref 65–99)
Glucose-Capillary: 96 mg/dL (ref 65–99)
Glucose-Capillary: 92 mg/dL (ref 65–99)

## 2015-04-21 NOTE — Care Management Important Message (Signed)
Important Message  Patient Details  Name: Ian KnackRobert Steinmeyer MRN: [id number] Date of Birth: Sep 05, 1933   Medicare Important Message Given:  Yes    Zahki Hoogendoorn Abena 04/21/2015, 5:06 PM
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
## 2015-04-21 NOTE — Progress Notes (Signed)
No new issues or problems. Patient making slow progress. Main issue currently is that of dysphagia and some dysphonia. Wound healing well. No new neurologic symptoms. Continue current efforts at mobilization and therapy.

## 2015-04-22 ENCOUNTER — Inpatient Hospital Stay (HOSPITAL_COMMUNITY): Payer: No Typology Code available for payment source

## 2015-04-22 LAB — CBC
HEMATOCRIT: 25.1 % — AB (ref 39.0–52.0)
HEMOGLOBIN: 7.6 g/dL — AB (ref 13.0–17.0)
MCH: 29.5 pg (ref 26.0–34.0)
MCHC: 30.3 g/dL (ref 30.0–36.0)
MCV: 97.3 fL (ref 78.0–100.0)
Platelets: 257 10*3/uL (ref 150–400)
RBC: 2.58 MIL/uL — ABNORMAL LOW (ref 4.22–5.81)
RDW: 15.4 % (ref 11.5–15.5)
WBC: 11.5 10*3/uL — AB (ref 4.0–10.5)

## 2015-04-22 LAB — BASIC METABOLIC PANEL
Anion gap: 7 (ref 5–15)
BUN: 36 mg/dL — ABNORMAL HIGH (ref 6–20)
CALCIUM: 8.4 mg/dL — AB (ref 8.9–10.3)
CHLORIDE: 117 mmol/L — AB (ref 101–111)
CO2: 23 mmol/L (ref 22–32)
CREATININE: 1.26 mg/dL — AB (ref 0.61–1.24)
GFR, EST NON AFRICAN AMERICAN: 52 mL/min — AB (ref >60)
Glucose, Bld: 110 mg/dL — ABNORMAL HIGH (ref 65–99)
Potassium: 4.3 mmol/L (ref 3.5–5.1)
SODIUM: 147 mmol/L — AB (ref 135–145)

## 2015-04-22 LAB — TRIGLYCERIDES: TRIGLYCERIDES: 145 mg/dL (ref <150)

## 2015-04-22 LAB — GLUCOSE, CAPILLARY
GLUCOSE-CAPILLARY: 101 mg/dL — AB (ref 65–99)
GLUCOSE-CAPILLARY: 97 mg/dL (ref 65–99)
GLUCOSE-CAPILLARY: 113 mg/dL — AB (ref 65–99)
Glucose-Capillary: 114 mg/dL — ABNORMAL HIGH (ref 65–99)
Glucose-Capillary: 132 mg/dL — ABNORMAL HIGH (ref 65–99)
Glucose-Capillary: 92 mg/dL (ref 65–99)
Glucose-Capillary: 94 mg/dL (ref 65–99)

## 2015-04-22 LAB — CULTURE, RESPIRATORY W GRAM STAIN

## 2015-04-22 LAB — CULTURE, RESPIRATORY
CULTURE: NORMAL
SPECIAL REQUESTS: NORMAL

## 2015-04-22 LAB — MAGNESIUM: MAGNESIUM: 2.5 mg/dL — AB (ref 1.7–2.4)

## 2015-04-22 MED ORDER — PIVOT 1.5 CAL PO LIQD
1000.0000 mL | ORAL | Status: DC
  Administered 2015-04-22 – 2015-04-23 (×2): 1000 mL
  Filled 2015-04-22 (×3): qty 1000

## 2015-04-22 MED ORDER — CETYLPYRIDINIUM CHLORIDE 0.05 % MT LIQD
7.0000 mL | Freq: Two times a day (BID) | OROMUCOSAL | Status: DC
  Administered 2015-04-22 – 2015-04-23 (×3): 7 mL via OROMUCOSAL
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
## 2015-04-22 NOTE — Clinical Social Work Note (Signed)
Clinical Social Worker continuing to follow patient and family for support and discharge planning needs.  Patient is no longer intubated and went for a Modified Barium Swallow study today.  Patient unfortunately did not pass and remains NPO at this time.  CSW has communicated with patient son, Ian HuaDavid, and [name] in regards to patient placement at discharge.  CSW remains available for support and to facilitate patient discharge needs once medically stable.  [name], [place] [phone]
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
## 2015-04-22 NOTE — Progress Notes (Signed)
Speech Pathology:  Results of MBS:  Clinical Impression Pt presents with a severe dysphagia, marked by limited effort to manipulate POs orally, with eventual spillage into pharynx.  Materials fill pharynx, weak swallow is triggered after significant delay, and aspiration occurs during and after the swallow - aspiration present with all consistencies.  Diffuse residue remains in pharynx, and it continues to spill posteriorly into trachea.  There is an inconsistent cough in response to aspiration.  Pt was unable to follow commands for cough or effortful swallow.  Study was discontinued and oral suctioning provided in an effort to remove residue.  Pt has not made clinical gains with swallow since initial assessment on 04/16/15.  Recommend continued NPO - SLP will follow for therapeutic exercise, trial POs as tolerated.       [name] Fredericouture, [place] CCC/SLP Pager [phone]
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
## 2015-04-22 NOTE — Progress Notes (Signed)
No significant change in status. Patient remains confused. Minimal progress with therapy. No new recommendations.

## 2015-04-23 ENCOUNTER — Inpatient Hospital Stay (HOSPITAL_COMMUNITY): Payer: No Typology Code available for payment source

## 2015-04-23 DIAGNOSIS — R410 Disorientation, unspecified: Secondary | ICD-10-CM | POA: Insufficient documentation

## 2015-04-23 DIAGNOSIS — J9690 Respiratory failure, unspecified, unspecified whether with hypoxia or hypercapnia: Secondary | ICD-10-CM

## 2015-04-23 DIAGNOSIS — J9600 Acute respiratory failure, unspecified whether with hypoxia or hypercapnia: Secondary | ICD-10-CM

## 2015-04-23 DIAGNOSIS — S299XXA Unspecified injury of thorax, initial encounter: Secondary | ICD-10-CM | POA: Insufficient documentation

## 2015-04-23 DIAGNOSIS — S298XXA Other specified injuries of thorax, initial encounter: Secondary | ICD-10-CM

## 2015-04-23 DIAGNOSIS — I469 Cardiac arrest, cause unspecified: Secondary | ICD-10-CM

## 2015-04-23 DIAGNOSIS — J189 Pneumonia, unspecified organism: Secondary | ICD-10-CM | POA: Insufficient documentation

## 2015-04-23 DIAGNOSIS — J9620 Acute and chronic respiratory failure, unspecified whether with hypoxia or hypercapnia: Secondary | ICD-10-CM | POA: Insufficient documentation

## 2015-04-23 DIAGNOSIS — S12100A Unspecified displaced fracture of second cervical vertebra, initial encounter for closed fracture: Secondary | ICD-10-CM

## 2015-04-23 LAB — BASIC METABOLIC PANEL
ANION GAP: 7 (ref 5–15)
Anion gap: 7 (ref 5–15)
BUN: 29 mg/dL — ABNORMAL HIGH (ref 6–20)
BUN: 39 mg/dL — AB (ref 6–20)
CHLORIDE: 120 mmol/L — AB (ref 101–111)
CO2: 14 mmol/L — ABNORMAL LOW (ref 22–32)
CO2: 19 mmol/L — AB (ref 22–32)
CREATININE: 1.1 mg/dL (ref 0.61–1.24)
Calcium: 4.9 mg/dL — CL (ref 8.9–10.3)
Calcium: 8.2 mg/dL — ABNORMAL LOW (ref 8.9–10.3)
Chloride: 123 mmol/L — ABNORMAL HIGH (ref 101–111)
Creatinine, Ser: 0.91 mg/dL (ref 0.61–1.24)
GFR calc non Af Amer: >60 mL/min (ref >60)
GLUCOSE: 331 mg/dL — AB (ref 65–99)
GLUCOSE: 207 mg/dL — AB (ref 65–99)
POTASSIUM: 2.7 mmol/L — AB (ref 3.5–5.1)
Potassium: 4.2 mmol/L (ref 3.5–5.1)
Sodium: 144 mmol/L (ref 135–145)
Sodium: 146 mmol/L — ABNORMAL HIGH (ref 135–145)

## 2015-04-23 LAB — CBC
HCT: 17.9 % — ABNORMAL LOW (ref 39.0–52.0)
HCT: 26.9 % — ABNORMAL LOW (ref 39.0–52.0)
HEMATOCRIT: 25.4 % — AB (ref 39.0–52.0)
HEMOGLOBIN: 5.4 g/dL — AB (ref 13.0–17.0)
Hemoglobin: 7.9 g/dL — ABNORMAL LOW (ref 13.0–17.0)
Hemoglobin: 8.5 g/dL — ABNORMAL LOW (ref 13.0–17.0)
MCH: 30.4 pg (ref 26.0–34.0)
MCH: 29.8 pg (ref 26.0–34.0)
MCH: 30.2 pg (ref 26.0–34.0)
MCHC: 31.1 g/dL (ref 30.0–36.0)
MCHC: 30.2 g/dL (ref 30.0–36.0)
MCHC: 31.6 g/dL (ref 30.0–36.0)
MCV: 97.7 fL (ref 78.0–100.0)
MCV: 98.9 fL (ref 78.0–100.0)
MCV: 95.7 fL (ref 78.0–100.0)
PLATELETS: 267 10*3/uL (ref 150–400)
PLATELETS: 198 10*3/uL (ref 150–400)
PLATELETS: 329 10*3/uL (ref 150–400)
RBC: 2.6 MIL/uL — ABNORMAL LOW (ref 4.22–5.81)
RBC: 1.81 MIL/uL — ABNORMAL LOW (ref 4.22–5.81)
RBC: 2.81 MIL/uL — ABNORMAL LOW (ref 4.22–5.81)
RDW: 15.4 % (ref 11.5–15.5)
RDW: 15.5 % (ref 11.5–15.5)
RDW: 15.2 % (ref 11.5–15.5)
WBC: 10.6 10*3/uL — AB (ref 4.0–10.5)
WBC: 11.5 10*3/uL — ABNORMAL HIGH (ref 4.0–10.5)
WBC: 26 10*3/uL — ABNORMAL HIGH (ref 4.0–10.5)

## 2015-04-23 LAB — GLUCOSE, CAPILLARY
GLUCOSE-CAPILLARY: 121 mg/dL — AB (ref 65–99)
GLUCOSE-CAPILLARY: 148 mg/dL — AB (ref 65–99)
GLUCOSE-CAPILLARY: 283 mg/dL — AB (ref 65–99)
GLUCOSE-CAPILLARY: 250 mg/dL — AB (ref 65–99)
Glucose-Capillary: 126 mg/dL — ABNORMAL HIGH (ref 65–99)
Glucose-Capillary: 257 mg/dL — ABNORMAL HIGH (ref 65–99)

## 2015-04-23 LAB — PREPARE RBC (CROSSMATCH)

## 2015-04-23 LAB — PROTIME-INR
INR: 1.57 — AB (ref 0.00–1.49)
PROTHROMBIN TIME: 18.8 s — AB (ref 11.6–15.2)

## 2015-04-23 LAB — LACTIC ACID, PLASMA: LACTIC ACID, VENOUS: 1.2 mmol/L (ref 0.5–2.0)

## 2015-04-23 LAB — APTT
APTT: 43 s — AB (ref 24–37)
APTT: 38 s — AB (ref 24–37)

## 2015-04-23 LAB — TROPONIN I: TROPONIN I: 0.06 ng/mL — AB (ref <0.031)

## 2015-04-23 MED ORDER — DOCUSATE SODIUM 50 MG/5ML PO LIQD: 100.0000 mg | Freq: Two times a day (BID) | ORAL | Status: DC | PRN

## 2015-04-23 MED ORDER — SODIUM CHLORIDE 0.9 % IV SOLN
2.0000 g | Freq: Once | INTRAVENOUS | Status: AC
  Administered 2015-04-23: 2 g via INTRAVENOUS
  Filled 2015-04-23: qty 20

## 2015-04-23 MED ORDER — FENTANYL BOLUS VIA INFUSION
25.0000 ug | INTRAVENOUS | Status: DC | PRN
  Administered 2015-04-23 – 2015-05-03 (×7): 25 ug via INTRAVENOUS
  Filled 2015-04-23: qty 25

## 2015-04-23 MED ORDER — ARTIFICIAL TEARS OP OINT
1.0000 "application " | TOPICAL_OINTMENT | Freq: Three times a day (TID) | OPHTHALMIC | Status: DC
  Administered 2015-04-23 – 2015-04-24 (×5): 1 via OPHTHALMIC
  Filled 2015-04-23 (×2): qty 3.5

## 2015-04-23 MED ORDER — FENTANYL CITRATE (PF) 100 MCG/2ML IJ SOLN: 50.0000 ug | Freq: Once | INTRAMUSCULAR | Status: DC

## 2015-04-23 MED ORDER — SODIUM CHLORIDE 0.9 % IV SOLN
INTRAVENOUS | Status: DC
  Filled 2015-04-23: qty 2.5

## 2015-04-23 MED ORDER — MIDAZOLAM BOLUS VIA INFUSION
1.0000 mg | INTRAVENOUS | Status: DC | PRN
  Administered 2015-04-23: 1 mg via INTRAVENOUS
  Filled 2015-04-23 (×2): qty 1

## 2015-04-23 MED ORDER — FENTANYL CITRATE (PF) 100 MCG/2ML IJ SOLN
50.0000 ug | INTRAMUSCULAR | Status: DC | PRN
Start: 2015-04-23 — End: 2015-04-23

## 2015-04-23 MED ORDER — DEXTROSE 5 % IV SOLN
2.0000 g | Freq: Two times a day (BID) | INTRAVENOUS | Status: DC
  Administered 2015-04-23 – 2015-04-24 (×2): 2 g via INTRAVENOUS
  Filled 2015-04-23 (×3): qty 2

## 2015-04-23 MED ORDER — SODIUM CHLORIDE 0.9 % IV SOLN
2000.0000 mL | Freq: Once | INTRAVENOUS | Status: AC
  Administered 2015-04-23: 2000 mL via INTRAVENOUS

## 2015-04-23 MED ORDER — FENTANYL CITRATE (PF) 100 MCG/2ML IJ SOLN
INTRAMUSCULAR | Status: AC
  Filled 2015-04-23: qty 2

## 2015-04-23 MED ORDER — MIDAZOLAM HCL 2 MG/2ML IJ SOLN: 1.0000 mg | Freq: Once | INTRAMUSCULAR | Status: DC

## 2015-04-23 MED ORDER — SODIUM CHLORIDE 0.9 % IV SOLN
1.0000 mg/h | INTRAVENOUS | Status: DC
  Administered 2015-04-23 – 2015-04-24 (×2): 4 mg/h via INTRAVENOUS
  Filled 2015-04-23 (×4): qty 10

## 2015-04-23 MED ORDER — VANCOMYCIN HCL IN DEXTROSE 750-5 MG/150ML-% IV SOLN
750.0000 mg | Freq: Two times a day (BID) | INTRAVENOUS | Status: DC
  Administered 2015-04-23 – 2015-04-24 (×2): 750 mg via INTRAVENOUS
  Filled 2015-04-23 (×3): qty 150

## 2015-04-23 MED ORDER — CISATRACURIUM BOLUS VIA INFUSION
0.0500 mg/kg | INTRAVENOUS | Status: DC | PRN
  Filled 2015-04-23: qty 5

## 2015-04-23 MED ORDER — CISATRACURIUM BOLUS VIA INFUSION
0.1000 mg/kg | Freq: Once | INTRAVENOUS | Status: AC
  Administered 2015-04-23: 8.6 mg via INTRAVENOUS
  Filled 2015-04-23: qty 9

## 2015-04-23 MED ORDER — SODIUM CHLORIDE 0.9 % IV SOLN
1.0000 ug/kg/min | INTRAVENOUS | Status: DC
  Filled 2015-04-23: qty 20

## 2015-04-23 MED ORDER — INSULIN ASPART 100 UNIT/ML ~~LOC~~ SOLN
2.0000 [IU] | SUBCUTANEOUS | Status: DC
  Administered 2015-04-24: 4 [IU] via SUBCUTANEOUS

## 2015-04-23 MED ORDER — NOREPINEPHRINE BITARTRATE 1 MG/ML IV SOLN
0.0000 ug/min | INTRAVENOUS | Status: DC
  Administered 2015-04-23: 18 ug/min via INTRAVENOUS
  Filled 2015-04-23 (×2): qty 4

## 2015-04-23 MED ORDER — INSULIN ASPART 100 UNIT/ML ~~LOC~~ SOLN
0.0000 [IU] | SUBCUTANEOUS | Status: DC
  Administered 2015-04-23: 3 [IU] via SUBCUTANEOUS
  Administered 2015-04-23: 7 [IU] via SUBCUTANEOUS

## 2015-04-23 MED ORDER — CETYLPYRIDINIUM CHLORIDE 0.05 % MT LIQD
7.0000 mL | OROMUCOSAL | Status: DC
  Administered 2015-04-23 – 2015-05-04 (×118): 7 mL via OROMUCOSAL

## 2015-04-23 MED ORDER — INSULIN ASPART 100 UNIT/ML ~~LOC~~ SOLN
2.0000 [IU] | SUBCUTANEOUS | Status: DC
  Administered 2015-04-23: 4 [IU] via SUBCUTANEOUS

## 2015-04-23 MED ORDER — SODIUM CHLORIDE 0.9 % IV SOLN
INTRAVENOUS | Status: DC
  Administered 2015-04-23 – 2015-04-26 (×3): via INTRAVENOUS

## 2015-04-23 MED ORDER — POTASSIUM CHLORIDE 10 MEQ/50ML IV SOLN
10.0000 meq | INTRAVENOUS | Status: AC
Start: 2015-04-23 — End: 2015-04-24
  Administered 2015-04-23 – 2015-04-24 (×6): 10 meq via INTRAVENOUS
  Filled 2015-04-23 (×5): qty 50

## 2015-04-23 MED ORDER — FENTANYL CITRATE (PF) 100 MCG/2ML IJ SOLN: 50.0000 ug | INTRAMUSCULAR | Status: DC | PRN

## 2015-04-23 MED ORDER — SODIUM CHLORIDE 0.9 % IV SOLN
Freq: Once | INTRAVENOUS | Status: AC
  Administered 2015-04-23: 16:00:00 via INTRAVENOUS

## 2015-04-23 MED ORDER — SODIUM CHLORIDE 0.9 % IV SOLN
25.0000 ug/h | INTRAVENOUS | Status: DC
  Administered 2015-04-24: 200 ug/h via INTRAVENOUS
  Filled 2015-04-23 (×6): qty 50

## 2015-04-23 MED ORDER — NOREPINEPHRINE BITARTRATE 1 MG/ML IV SOLN
0.0000 ug/min | INTRAVENOUS | Status: DC
  Administered 2015-04-25: 4 ug/min via INTRAVENOUS
  Administered 2015-04-25: 12 ug/min via INTRAVENOUS
  Administered 2015-04-25: 26 ug/min via INTRAVENOUS
  Administered 2015-04-26: 12 ug/min via INTRAVENOUS
  Administered 2015-04-27: 10 ug/min via INTRAVENOUS
  Filled 2015-04-23 (×6): qty 16

## 2015-04-23 MED ORDER — ASPIRIN 300 MG RE SUPP
300.0000 mg | RECTAL | Status: AC
  Administered 2015-04-23: 300 mg via RECTAL
  Filled 2015-04-23: qty 1

## 2015-04-23 NOTE — Progress Notes (Signed)
eLink Physician-Brief Progress Note Patient Name: Ian KnackRobert Marks DOB: 1933/05/14 MRN: [id number]   Date of Service  04/23/2015  HPI/Events of Note  K+ = 2.7, Ca++ = 4.9 and Creatinine = 0.91.  eICU Interventions  Will order: 1. Replete K+ and Ca++. 2. Re-check BMP and ionized Ca++ at 12 midnight.       Intervention Category Major Interventions: Electrolyte abnormality - evaluation and management  Sommer,Steven Eugene 04/23/2015, 5:12 PM

## 2015-04-23 NOTE — Procedures (Signed)
Arterial Catheter Insertion Procedure Note [name] [id number] [name]  Procedure: Insertion of Arterial Catheter  Indications: Blood pressure monitoring and Frequent blood sampling  Procedure Details Consent: Unable to obtain consent because of altered level of consciousness. Time Out: Verified patient identification, verified procedure, site/side was marked, verified correct patient position, special equipment/implants available, medications/allergies/relevent history reviewed, required imaging and test results available.  Performed  Maximum sterile technique was used including antiseptics, cap, gloves, gown, hand hygiene, mask and sheet. Skin prep: Chlorhexidine; local anesthetic administered 20 gauge catheter was inserted into left femoral artery using the Seldinger technique.  Evaluation Blood flow good; BP tracing good. Complications: No apparent complications.   Performed by Ian LericheMagdalene Tukov, NP. Supervised by Ian Guysahul Desai, PA - C.   [name] - C [name] Pager: [phone]  or [phone] 04/23/2015, 9:06 PM  Required for hypothrmia, high map goals, pressors [name]  Ian RossettiDaniel J. Tyson [name], MD, FACP Pgr: [phone]  Pulmonary & Critical Care
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
## 2015-04-23 NOTE — Progress Notes (Signed)
Received call from Sela HuaPat S., RN, regarding critical lab value Hgb 5.4 and notified eMD Dr. Arsenio LoaderSommer.

## 2015-04-23 NOTE — Progress Notes (Signed)
eLink Physician-Brief Progress Note Patient Name: Ian KnackRobert Gianfrancesco DOB: 1933-08-09 MRN: [id number]   Date of Service  04/23/2015  HPI/Events of Note  Blood glucose = 283.   eICU Interventions  Will order Q 4 hour sensitive Novolog SSI.     Intervention Category Major Interventions: Hyperglycemia - active titration of insulin therapy  Lenell AntuSommer,Steven Eugene 04/23/2015, 4:42 PM

## 2015-04-23 NOTE — Procedures (Signed)
Bronchoscopy Procedure Note Ian KnackRobert Marks [id number] [birth year]  Procedure: Bronchoscopy Indications: Diagnostic evaluation of the airways, Obtain specimens for culture and/or other diagnostic studies and Remove secretions  Procedure Details Consent: Unable to obtain consent because of emergent medical necessity. Time Out: Verified patient identification, verified procedure, site/side was marked, verified correct patient position, special equipment/implants available, medications/allergies/relevent history reviewed, required imaging and test results available.  Performed  In preparation for procedure, patient was given 100% FiO2 and bronchoscope lubricated. Sedation: fentnayl  Airway entered and the following bronchi were examined: RUL, RML, RLL, LUL, LLL and Bronchi.   Procedures performed: aspiration utilized alligator forceps  Bronchoscope removed.  , Patient placed back on 100% FiO2 at conclusion of procedure.    Evaluation Hemodynamic Status: just back from code; O2 sats: transiently fell during during procedure Patient's Current Condition: unstable Specimens:  Sent purulent fluid Complications: No apparent complications Patient did tolerate procedure well.   [name],[name]   1. [name] / clot/ mucous/ skin like obstructing trachea and left main 2. Pus RLL, suctioned 3. rUtilized mucomyst, bicarb and alligator forceps to break down this structure / Marks successfully - sent to pathology 4. Cleared airways 5. [name] (not c/w cancer lesion) - mobile etc  Mcarthur Rossettianiel J. Tyson [name], MD, FACP Pgr: [phone] [facility]

## 2015-04-23 NOTE — Progress Notes (Signed)
eLink Physician-Brief Progress Note Patient Name: Ian KnackRobert Pinedo DOB: [birth year] MRN: [id number]   Date of Service  04/23/2015  HPI/Events of Note  Anemia - Hgb = 5.4.  eICU Interventions  Will transfuse 2 units PRBC when available.      Intervention Category Major Interventions: Other:  Lenell AntuSommer,Steven Eugene 04/23/2015, 4:23 PM
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
## 2015-04-23 NOTE — Progress Notes (Signed)
[facility] critical lab value K+ 2.7 and Ca 4.9 and notified eMD Dr. Arsenio LoaderSommer

## 2015-04-23 NOTE — Procedures (Signed)
Intubation Procedure Note Ian KnackRobert Marks [id number] [birth year]  Procedure: Intubation Indications: Respiratory insufficiency  Procedure Details Consent: Unable to obtain consent because of emergent medical necessity. Time Out: Verified patient identification, verified procedure, site/side was marked, verified correct patient position, special equipment/implants available, medications/allergies/relevent history reviewed, required imaging and test results available.  Performed  Maximum sterile technique was used including gloves, gown, hand hygiene and mask.  MAC and 3    Evaluation Hemodynamic Status: BP stable throughout; O2 sats: currently acceptable Patient's Current Condition: stable Complications: No apparent complications Patient did tolerate procedure well. Chest X-ray ordered to verify placement.  CXR: tube position acceptable.  Pt intubated using a Mac #4 blade with a 7.5 ett secured at 24 at the lip with a commercial tube holder during Code Blue. Pt bronched with verification from Dr [name] of good ett position. CXR also verified by Dr [name]. Pt with rhonchi BS on Right and diminished BS on left due to mucous plug. Positive color change noted on etco2, etco2 on Zoll reading 28, bilateral BS. RT will continue to monitor.   Ian ShiverKelley, [name] 04/23/2015

## 2015-04-23 NOTE — Progress Notes (Signed)
EEG Completed; Results Pending  

## 2015-04-23 NOTE — Progress Notes (Signed)
Pt unavailable at this time for EEG; awaiting transfer to 2H.

## 2015-04-23 NOTE — Progress Notes (Signed)
Pharmacy Antibiotic Note  Ian KnackRobert Fujita is a 82 y.o. year-old male admitted on 04/12/2015.  The patient is currently on day #1 of Vancomycin and Cefepime for HCAP coverage.  Assessment/Plan:  PEA arrest this afternoon due to mucous plugging. Reintubated.  Cefepime stopped 1/11 after 9 days of therapy. Resuming today, in addition to Vancomycin, for HCAP coverage.  Will follow renal function, culture data, progress.  Temp (24hrs), Avg:98.9 F (37.2 C), Min:98 F (36.7 C), Max:100 F (37.8 C)   Recent Labs Lab 04/17/15 0522 04/18/15 0237 04/20/15 1204 04/22/15 0630 04/23/15 0342  WBC 13.3* 11.9* 10.4 11.5* 10.6*    Recent Labs Lab 04/16/15 1938 04/17/15 0522 04/18/15 0237 04/20/15 1204 04/22/15 0630  CREATININE 1.50* 1.43* 1.45* 1.46* 1.26*   Estimated Creatinine Clearance: 50.6 mL/min (by C-G formula based on Cr of 1.26).    Allergies  Allergen Reactions  . Bee Venom Anaphylaxis    Allergic reaction-anaphylaxis? Has epipen  . Morphine And Related Anxiety    Family requested Morphine be not given because of pt having extreme agitation and anxiety.  [name]. Levaquin [Levofloxacin In D5w] Rash  . Levofloxacin Rash  . Lipitor [Atorvastatin] Other (See Comments)  . Penicillins Hives  . Zithromax [Azithromycin] Other (See Comments)    Antimicrobials this admission:  Vanc 1/3 >> 1/4: restarting 1/12>>  Cefepime 1/2 >> 1/11; restarting 1/12>>  Levels/dose changes this admission:  no levels yet.  Target vancomycin troughs 15-20 mcg/ml  Microbiology results: 1/12 trach aspirate - 1/9 trach aspirate - normal flora 1/1 MRSA PCR: negative   Dennie Fettersgan, Cynde Menard [place], [place] Pager: [phone] 04/23/2015 3:34 PM
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
## 2015-04-23 NOTE — Progress Notes (Signed)
eLink Physician-Brief Progress Note Patient Name: Ian KnackRobert Burack DOB: 08/03/1933 MRN: [id number]   Date of Service  04/23/2015  HPI/Events of Note  Recheck of Hgb 8.5 after Hgb of 5.4.  eICU Interventions  Plan: Hold on PRBC transfusion     Intervention Category Evaluation Type: Other  DETERDING,ELIZABETH 04/23/2015, 11:16 PM

## 2015-04-23 NOTE — Progress Notes (Signed)
Entered patients room to correct IV occulusion and patient began coughing. Encouraged patient to continue coughing in an attempt to clear his airway. Patient began to look distressed therefore I called respiratory therapy to assist in suctioning patient. Before respiratory therapy could make it to the room the patients oxygen saturations began to drop. Non-rebreather was then applied with no correction in desaturations. Code blue was then called because patient stopped breathing and he no longer had a palpable pulse. ACLS protocol was initiated with return of pulse. Patient was intubated at bedside and a bedside bronch was preformed. Artic Sun protocol was initiated at bedside per MD orders. Pt was then transferred to 2H01 with bedside report given.

## 2015-04-24 ENCOUNTER — Inpatient Hospital Stay (HOSPITAL_COMMUNITY): Payer: No Typology Code available for payment source

## 2015-04-24 LAB — BASIC METABOLIC PANEL
ANION GAP: 8 (ref 5–15)
ANION GAP: 6 (ref 5–15)
Anion gap: 8 (ref 5–15)
Anion gap: 9 (ref 5–15)
Anion gap: 8 (ref 5–15)
Anion gap: 9 (ref 5–15)
BUN: 37 mg/dL — ABNORMAL HIGH (ref 6–20)
BUN: 35 mg/dL — ABNORMAL HIGH (ref 6–20)
BUN: 33 mg/dL — ABNORMAL HIGH (ref 6–20)
BUN: 32 mg/dL — ABNORMAL HIGH (ref 6–20)
BUN: 27 mg/dL — ABNORMAL HIGH (ref 6–20)
BUN: 29 mg/dL — ABNORMAL HIGH (ref 6–20)
CALCIUM: 8.1 mg/dL — AB (ref 8.9–10.3)
CALCIUM: 8 mg/dL — AB (ref 8.9–10.3)
CALCIUM: 8.3 mg/dL — AB (ref 8.9–10.3)
CALCIUM: 8.3 mg/dL — AB (ref 8.9–10.3)
CALCIUM: 8.2 mg/dL — AB (ref 8.9–10.3)
CALCIUM: 8.3 mg/dL — AB (ref 8.9–10.3)
CO2: 19 mmol/L — ABNORMAL LOW (ref 22–32)
CO2: 20 mmol/L — ABNORMAL LOW (ref 22–32)
CO2: 18 mmol/L — AB (ref 22–32)
CO2: 19 mmol/L — AB (ref 22–32)
CO2: 17 mmol/L — AB (ref 22–32)
CO2: 17 mmol/L — AB (ref 22–32)
CREATININE: 1.04 mg/dL (ref 0.61–1.24)
CREATININE: 0.89 mg/dL (ref 0.61–1.24)
CREATININE: 0.93 mg/dL (ref 0.61–1.24)
CREATININE: 0.89 mg/dL (ref 0.61–1.24)
CREATININE: 0.87 mg/dL (ref 0.61–1.24)
CREATININE: 0.92 mg/dL (ref 0.61–1.24)
Chloride: 120 mmol/L — ABNORMAL HIGH (ref 101–111)
Chloride: 122 mmol/L — ABNORMAL HIGH (ref 101–111)
Chloride: 120 mmol/L — ABNORMAL HIGH (ref 101–111)
Chloride: 119 mmol/L — ABNORMAL HIGH (ref 101–111)
Chloride: 122 mmol/L — ABNORMAL HIGH (ref 101–111)
Chloride: 122 mmol/L — ABNORMAL HIGH (ref 101–111)
GFR calc Af Amer: >60 mL/min (ref >60)
GFR calc non Af Amer: >60 mL/min (ref >60)
GLUCOSE: 140 mg/dL — AB (ref 65–99)
GLUCOSE: 131 mg/dL — AB (ref 65–99)
Glucose, Bld: 117 mg/dL — ABNORMAL HIGH (ref 65–99)
Glucose, Bld: 142 mg/dL — ABNORMAL HIGH (ref 65–99)
Glucose, Bld: 138 mg/dL — ABNORMAL HIGH (ref 65–99)
Glucose, Bld: 121 mg/dL — ABNORMAL HIGH (ref 65–99)
Potassium: 4.4 mmol/L (ref 3.5–5.1)
Potassium: 4.1 mmol/L (ref 3.5–5.1)
Potassium: 3.9 mmol/L (ref 3.5–5.1)
Potassium: 3.9 mmol/L (ref 3.5–5.1)
Potassium: 3.6 mmol/L (ref 3.5–5.1)
Potassium: 4 mmol/L (ref 3.5–5.1)
SODIUM: 148 mmol/L — AB (ref 135–145)
SODIUM: 146 mmol/L — AB (ref 135–145)
SODIUM: 147 mmol/L — AB (ref 135–145)
Sodium: 147 mmol/L — ABNORMAL HIGH (ref 135–145)
Sodium: 147 mmol/L — ABNORMAL HIGH (ref 135–145)
Sodium: 148 mmol/L — ABNORMAL HIGH (ref 135–145)

## 2015-04-24 LAB — GLUCOSE, CAPILLARY
GLUCOSE-CAPILLARY: 106 mg/dL — AB (ref 65–99)
GLUCOSE-CAPILLARY: 107 mg/dL — AB (ref 65–99)
GLUCOSE-CAPILLARY: 118 mg/dL — AB (ref 65–99)
GLUCOSE-CAPILLARY: 121 mg/dL — AB (ref 65–99)
GLUCOSE-CAPILLARY: 152 mg/dL — AB (ref 65–99)
GLUCOSE-CAPILLARY: 180 mg/dL — AB (ref 65–99)
Glucose-Capillary: 293 mg/dL — ABNORMAL HIGH (ref 65–99)
Glucose-Capillary: 333 mg/dL — ABNORMAL HIGH (ref 65–99)
Glucose-Capillary: 191 mg/dL — ABNORMAL HIGH (ref 65–99)
Glucose-Capillary: 130 mg/dL — ABNORMAL HIGH (ref 65–99)
Glucose-Capillary: 107 mg/dL — ABNORMAL HIGH (ref 65–99)
Glucose-Capillary: 131 mg/dL — ABNORMAL HIGH (ref 65–99)
Glucose-Capillary: 122 mg/dL — ABNORMAL HIGH (ref 65–99)

## 2015-04-24 LAB — BLOOD GAS, ARTERIAL
Acid-base deficit: 6.5 mmol/L — ABNORMAL HIGH (ref 0.0–2.0)
BICARBONATE: 18.3 meq/L — AB (ref 20.0–24.0)
DRAWN BY: 280981
FIO2: 1
MECHVT: 500 mL
O2 SAT: 94.5 %
PATIENT TEMPERATURE: 98.6
PEEP: 10 cmH2O
PO2 ART: 83.9 mmHg (ref 80.0–100.0)
RATE: 23 resp/min
TCO2: 19.4 mmol/L (ref 0–100)
pCO2 arterial: 35.7 mmHg (ref 35.0–45.0)
pH, Arterial: 7.331 — ABNORMAL LOW (ref 7.350–7.450)

## 2015-04-24 LAB — POCT I-STAT, CHEM 8
BUN: 35 mg/dL — ABNORMAL HIGH (ref 6–20)
BUN: 35 mg/dL — ABNORMAL HIGH (ref 6–20)
CALCIUM ION: 1.08 mmol/L — AB (ref 1.13–1.30)
CALCIUM ION: 1.23 mmol/L (ref 1.13–1.30)
Chloride: 114 mmol/L — ABNORMAL HIGH (ref 101–111)
Chloride: 113 mmol/L — ABNORMAL HIGH (ref 101–111)
Creatinine, Ser: 0.9 mg/dL (ref 0.61–1.24)
Creatinine, Ser: 1 mg/dL (ref 0.61–1.24)
GLUCOSE: 314 mg/dL — AB (ref 65–99)
Glucose, Bld: 354 mg/dL — ABNORMAL HIGH (ref 65–99)
HCT: 26 % — ABNORMAL LOW (ref 39.0–52.0)
HCT: 25 % — ABNORMAL LOW (ref 39.0–52.0)
HEMOGLOBIN: 8.8 g/dL — AB (ref 13.0–17.0)
Hemoglobin: 8.5 g/dL — ABNORMAL LOW (ref 13.0–17.0)
POTASSIUM: 4 mmol/L (ref 3.5–5.1)
Potassium: 4.2 mmol/L (ref 3.5–5.1)
SODIUM: 143 mmol/L (ref 135–145)
Sodium: 145 mmol/L (ref 135–145)
TCO2: 18 mmol/L (ref 0–100)
TCO2: 17 mmol/L (ref 0–100)

## 2015-04-24 LAB — PROTIME-INR
INR: 1.16 (ref 0.00–1.49)
Prothrombin Time: 15 s (ref 11.6–15.2)

## 2015-04-24 LAB — CBC
HCT: 25.4 % — ABNORMAL LOW (ref 39.0–52.0)
HCT: 26.5 % — ABNORMAL LOW (ref 39.0–52.0)
HEMOGLOBIN: 8.1 g/dL — AB (ref 13.0–17.0)
Hemoglobin: 8.3 g/dL — ABNORMAL LOW (ref 13.0–17.0)
MCH: 30.8 pg (ref 26.0–34.0)
MCH: 29.5 pg (ref 26.0–34.0)
MCHC: 31.9 g/dL (ref 30.0–36.0)
MCHC: 31.3 g/dL (ref 30.0–36.0)
MCV: 96.6 fL (ref 78.0–100.0)
MCV: 94.3 fL (ref 78.0–100.0)
PLATELETS: 278 10*3/uL (ref 150–400)
PLATELETS: 293 10*3/uL (ref 150–400)
RBC: 2.63 MIL/uL — AB (ref 4.22–5.81)
RBC: 2.81 MIL/uL — ABNORMAL LOW (ref 4.22–5.81)
RDW: 15.1 % (ref 11.5–15.5)
RDW: 15.1 % (ref 11.5–15.5)
WBC: 20.2 10*3/uL — AB (ref 4.0–10.5)
WBC: 18.1 10*3/uL — AB (ref 4.0–10.5)

## 2015-04-24 LAB — POCT I-STAT 3, ART BLOOD GAS (G3+)
ACID-BASE DEFICIT: 7 mmol/L — AB (ref 0.0–2.0)
Bicarbonate: 17.8 meq/L — ABNORMAL LOW (ref 20.0–24.0)
O2 Saturation: 99 %
PH ART: 7.396 (ref 7.350–7.450)
TCO2: 19 mmol/L (ref 0–100)
pCO2 arterial: 27.9 mmHg — ABNORMAL LOW (ref 35.0–45.0)
pO2, Arterial: 133 mmHg — ABNORMAL HIGH (ref 80.0–100.0)

## 2015-04-24 LAB — TROPONIN I
TROPONIN I: 0.11 ng/mL — AB (ref <0.031)
TROPONIN I: 0.07 ng/mL — AB (ref <0.031)
Troponin I: 0.09 ng/mL — ABNORMAL HIGH (ref <0.031)

## 2015-04-24 LAB — APTT: APTT: 37 s (ref 24–37)

## 2015-04-24 MED ORDER — ACETYLCYSTEINE 20 % IN SOLN
4.0000 mL | Freq: Two times a day (BID) | RESPIRATORY_TRACT | Status: AC
  Administered 2015-04-24 – 2015-04-25 (×4): 4 mL via RESPIRATORY_TRACT
  Filled 2015-04-24 (×4): qty 4

## 2015-04-24 MED ORDER — INSULIN ASPART 100 UNIT/ML ~~LOC~~ SOLN
2.0000 [IU] | SUBCUTANEOUS | Status: DC
Start: 2015-04-24 — End: 2015-05-20
  Administered 2015-04-24 – 2015-04-26 (×5): 2 [IU] via SUBCUTANEOUS
  Administered 2015-04-26: 4 [IU] via SUBCUTANEOUS
  Administered 2015-04-26: 2 [IU] via SUBCUTANEOUS
  Administered 2015-04-26: 4 [IU] via SUBCUTANEOUS
  Administered 2015-04-27 (×3): 2 [IU] via SUBCUTANEOUS
  Administered 2015-04-27: 4 [IU] via SUBCUTANEOUS
  Administered 2015-04-27 – 2015-04-29 (×7): 2 [IU] via SUBCUTANEOUS
  Administered 2015-04-29: 4 [IU] via SUBCUTANEOUS
  Administered 2015-04-29: 2 [IU] via SUBCUTANEOUS
  Administered 2015-04-29: 4 [IU] via SUBCUTANEOUS
  Administered 2015-04-29: 2 [IU] via SUBCUTANEOUS
  Administered 2015-04-30: 4 [IU] via SUBCUTANEOUS
  Administered 2015-04-30: 2 [IU] via SUBCUTANEOUS
  Administered 2015-04-30: 4 [IU] via SUBCUTANEOUS
  Administered 2015-04-30 (×2): 2 [IU] via SUBCUTANEOUS
  Administered 2015-04-30 – 2015-05-01 (×2): 4 [IU] via SUBCUTANEOUS
  Administered 2015-05-01 – 2015-05-02 (×6): 2 [IU] via SUBCUTANEOUS
  Administered 2015-05-02 (×2): 4 [IU] via SUBCUTANEOUS
  Administered 2015-05-04 – 2015-05-09 (×15): 2 [IU] via SUBCUTANEOUS
  Administered 2015-05-09: 4 [IU] via SUBCUTANEOUS
  Administered 2015-05-10 – 2015-05-12 (×11): 2 [IU] via SUBCUTANEOUS
  Administered 2015-05-13: 4 [IU] via SUBCUTANEOUS
  Administered 2015-05-13: 2 [IU] via SUBCUTANEOUS
  Administered 2015-05-13: 4 [IU] via SUBCUTANEOUS
  Administered 2015-05-13: 2 [IU] via SUBCUTANEOUS
  Administered 2015-05-13: 4 [IU] via SUBCUTANEOUS
  Administered 2015-05-13 – 2015-05-14 (×2): 2 [IU] via SUBCUTANEOUS
  Administered 2015-05-14: 4 [IU] via SUBCUTANEOUS
  Administered 2015-05-14 – 2015-05-15 (×8): 2 [IU] via SUBCUTANEOUS
  Administered 2015-05-15: 4 [IU] via SUBCUTANEOUS
  Administered 2015-05-15: 2 [IU] via SUBCUTANEOUS
  Administered 2015-05-16 (×5): 4 [IU] via SUBCUTANEOUS
  Administered 2015-05-16 – 2015-05-17 (×2): 2 [IU] via SUBCUTANEOUS
  Administered 2015-05-17: 4 [IU] via SUBCUTANEOUS
  Administered 2015-05-17: 2 [IU] via SUBCUTANEOUS
  Administered 2015-05-17 (×2): 4 [IU] via SUBCUTANEOUS
  Administered 2015-05-18 (×5): 2 [IU] via SUBCUTANEOUS
  Administered 2015-05-18: 4 [IU] via SUBCUTANEOUS
  Administered 2015-05-19: 3 [IU] via SUBCUTANEOUS
  Administered 2015-05-19: 2 [IU] via SUBCUTANEOUS
  Administered 2015-05-19 (×3): 4 [IU] via SUBCUTANEOUS
  Administered 2015-05-19: 2 [IU] via SUBCUTANEOUS
  Administered 2015-05-20: 4 [IU] via SUBCUTANEOUS
  Administered 2015-05-20: 2 [IU] via SUBCUTANEOUS
  Administered 2015-05-20 (×3): 4 [IU] via SUBCUTANEOUS

## 2015-04-24 MED ORDER — CISATRACURIUM BOLUS VIA INFUSION
0.0500 mg/kg | INTRAVENOUS | Status: DC | PRN
  Filled 2015-04-24: qty 5

## 2015-04-24 MED ORDER — SODIUM CHLORIDE 0.9 % IV SOLN
1.0000 ug/kg/min | INTRAVENOUS | Status: DC
  Administered 2015-04-24: 1.5 ug/kg/min via INTRAVENOUS
  Filled 2015-04-24 (×2): qty 20

## 2015-04-24 MED ORDER — VANCOMYCIN HCL 10 G IV SOLR
1250.0000 mg | Freq: Two times a day (BID) | INTRAVENOUS | Status: DC
  Administered 2015-04-24 – 2015-04-26 (×4): 1250 mg via INTRAVENOUS
  Filled 2015-04-24 (×5): qty 1250

## 2015-04-24 MED ORDER — CEFEPIME HCL 2 G IJ SOLR
2.0000 g | Freq: Three times a day (TID) | INTRAMUSCULAR | Status: DC
  Administered 2015-04-24 – 2015-04-26 (×6): 2 g via INTRAVENOUS
  Filled 2015-04-24 (×7): qty 2

## 2015-04-24 MED ORDER — DOPAMINE-DEXTROSE 3.2-5 MG/ML-% IV SOLN
0.0000 ug/kg/min | INTRAVENOUS | Status: DC
  Administered 2015-04-24: 5 ug/kg/min via INTRAVENOUS
  Filled 2015-04-24: qty 250
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
## 2015-04-24 NOTE — Progress Notes (Signed)
SLP Cancellation Note  Patient Details Name: [name] MRN: [id number] DOB: 1933/09/08   Cancelled treatment:        Pt is on vent and sedated and unable to proceed with therapy at this time. MD note for possible trach and peg. Will follow.                                                                                                Lynita LombardLauren Dade Rodin 04/24/2015, 10:37 AM Lynita LombardLauren [place], Student-SLP

## 2015-04-24 NOTE — Progress Notes (Signed)
OT Cancellation Note  Patient Details Name: Ian KnackRobert Spates MRN: [id number] DOB: 1933-05-13   Cancelled Treatment:    Reason Eval/Treat Not Completed: Medical issues which prohibited therapy (Pt with cardiac arrest yesterday, now intubated an sedated.)  [name], [name] Lynn 04/24/2015, 1:26 PM

## 2015-04-24 NOTE — Progress Notes (Signed)
Patient ID: Ian KnackRobert Chuck, male   DOB: 1933-12-13, 82 y.o.   MRN: [id number] I was going to evaluate the larynx today. Given updated status, I will be available for tracheostomy if needed.
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
## 2015-04-24 NOTE — Care Management Important Message (Signed)
Important Message  Patient Details  Name: Ervin KnackRobert Westbay MRN: [id number] Date of Birth: 25-Aug-1933   Medicare Important Message Given:  Yes    Lagina Reader P Deanne Bedgood 04/24/2015, 2:32 PM

## 2015-04-24 NOTE — Progress Notes (Addendum)
Pharmacy Antibiotic Note  [name] is a 82 y.o. year-old male admitted on 04/12/2015.  The patient is currently on day #2 of Vancomycin and Cefepime for HAP coverage.  Assessment/Plan: - PEA arrest 1/12 due to mucous plugging, pt reintubated. - Vancomycin dose increased to 1250 mg IV q12h given improved renal fxn - Cefepime dose increased to 2 g IV q8h given improved renal fxn - Will follow renal function, culture data, progress.  Temp (24hrs), Avg:92.9 F (33.8 C), Min:89.6 F (32 C), Max:99 F (37.2 C)   Recent Labs Lab 04/22/15 0630 04/23/15 0342 04/23/15 1545 04/23/15 2241 04/24/15 0610  WBC 11.5* 10.6* 11.5* 26.0* 20.2*     Recent Labs Lab 04/23/15 1749 04/23/15 1939 04/23/15 1955 04/24/15 0228 04/24/15 0610  CREATININE 0.90 1.00 1.10 1.04 0.89   Estimated Creatinine Clearance: 78.9 mL/min (by C-G formula based on Cr of 0.89).    Allergies  Allergen Reactions  . Bee Venom Anaphylaxis    Allergic reaction-anaphylaxis? Has epipen  . Morphine And Related Anxiety    Family requested Morphine be not given because of pt having extreme agitation and anxiety.  [name]. Levaquin [Levofloxacin In D5w] Rash  . Levofloxacin Rash  . Lipitor [Atorvastatin] Other (See Comments)  . Penicillins Hives  . Zithromax [Azithromycin] Other (See Comments)    Antimicrobials this admission:  Vanc 1/3 >> 1/4: restarted 1/12>>  Cefepime 1/2 >> 1/11; restarted 1/12>>  Levels/dose changes this admission: 1/13 Vanc increased to 1250 mg q12h from 750 mg q12h 1/13 Cefepime increased to 2 g IV q8h from 2 g q12h  Microbiology results: 1/12 trach aspirate - GPC in clusters and pairs on stain 1/9 trach aspirate - normal flora 1/1 MRSA PCR: negative  [name], PharmD Clinical Pharmacy Resident Pager: [phone]  04/24/2015 8:06 AM

## 2015-04-24 NOTE — Progress Notes (Signed)
PT Cancellation Note  Patient Details Name: Ian KnackRobert Barkan MRN: [id number] DOB: 10-08-1933   Cancelled Treatment:    Reason Eval/Treat Not Completed: Medical issues which prohibited therapy (Pt with cardiac arrest and on hypothermia protocol. HOLD through Monday per nurse.  Will return Monday and progress as pt able.) Noted that lumbar corset did not make it to 2H.  Trying to locate brace - called son and checking other floors pt was on. Thanks.   Tawni MillersWhite, Jachob Mcclean F 04/24/2015, 9:41 AM Eber Jonesawn Amyah Clawson,PT Acute Rehabilitation [phone] (pager)

## 2015-04-25 ENCOUNTER — Inpatient Hospital Stay (HOSPITAL_COMMUNITY): Payer: No Typology Code available for payment source

## 2015-04-25 LAB — POCT I-STAT 3, ART BLOOD GAS (G3+)
Acid-base deficit: 11 mmol/L — ABNORMAL HIGH (ref 0.0–2.0)
BICARBONATE: 15.2 meq/L — AB (ref 20.0–24.0)
O2 Saturation: 98 %
PCO2 ART: 32.2 mmHg — AB (ref 35.0–45.0)
PO2 ART: 117 mmHg — AB (ref 80.0–100.0)
TCO2: 16 mmol/L (ref 0–100)
pH, Arterial: 7.282 — ABNORMAL LOW (ref 7.350–7.450)

## 2015-04-25 LAB — BASIC METABOLIC PANEL
Anion gap: 7 (ref 5–15)
Anion gap: 5 (ref 5–15)
BUN: 30 mg/dL — AB (ref 6–20)
BUN: 30 mg/dL — AB (ref 6–20)
CO2: 17 mmol/L — ABNORMAL LOW (ref 22–32)
CO2: 18 mmol/L — ABNORMAL LOW (ref 22–32)
CREATININE: 1.03 mg/dL (ref 0.61–1.24)
CREATININE: 1.14 mg/dL (ref 0.61–1.24)
Calcium: 8.3 mg/dL — ABNORMAL LOW (ref 8.9–10.3)
Calcium: 8.2 mg/dL — ABNORMAL LOW (ref 8.9–10.3)
Chloride: 122 mmol/L — ABNORMAL HIGH (ref 101–111)
Chloride: 123 mmol/L — ABNORMAL HIGH (ref 101–111)
GFR calc Af Amer: >60 mL/min (ref >60)
GFR, EST NON AFRICAN AMERICAN: 59 mL/min — AB (ref >60)
Glucose, Bld: 135 mg/dL — ABNORMAL HIGH (ref 65–99)
Glucose, Bld: 144 mg/dL — ABNORMAL HIGH (ref 65–99)
Potassium: 4.3 mmol/L (ref 3.5–5.1)
Potassium: 4.5 mmol/L (ref 3.5–5.1)
SODIUM: 146 mmol/L — AB (ref 135–145)
SODIUM: 146 mmol/L — AB (ref 135–145)

## 2015-04-25 LAB — GLUCOSE, CAPILLARY
GLUCOSE-CAPILLARY: 116 mg/dL — AB (ref 65–99)
GLUCOSE-CAPILLARY: 125 mg/dL — AB (ref 65–99)
GLUCOSE-CAPILLARY: 129 mg/dL — AB (ref 65–99)
GLUCOSE-CAPILLARY: 107 mg/dL — AB (ref 65–99)
GLUCOSE-CAPILLARY: 86 mg/dL (ref 65–99)
Glucose-Capillary: 121 mg/dL — ABNORMAL HIGH (ref 65–99)
Glucose-Capillary: 97 mg/dL (ref 65–99)

## 2015-04-25 LAB — CALCIUM, IONIZED: CALCIUM, IONIZED, SERUM: 4.9 mg/dL (ref 4.5–5.6)

## 2015-04-25 MED ORDER — SODIUM CHLORIDE 0.9 % IV BOLUS (SEPSIS)
500.0000 mL | Freq: Once | INTRAVENOUS | Status: AC
  Administered 2015-04-25: 500 mL via INTRAVENOUS

## 2015-04-25 MED ORDER — SODIUM CHLORIDE 0.9 % IV SOLN
25.0000 ug/h | INTRAVENOUS | Status: DC
  Administered 2015-04-25: 350 ug/h via INTRAVENOUS
  Administered 2015-04-26: 300 ug/h via INTRAVENOUS
  Administered 2015-04-26 – 2015-04-27 (×2): 175 ug/h via INTRAVENOUS
  Administered 2015-04-28: 200 ug/h via INTRAVENOUS
  Administered 2015-04-28: 175 ug/h via INTRAVENOUS
  Administered 2015-04-29: 200 ug/h via INTRAVENOUS
  Administered 2015-04-30: 15 ug/h via INTRAVENOUS
  Administered 2015-05-01: 25 ug/h via INTRAVENOUS
  Administered 2015-05-02: 125 ug/h via INTRAVENOUS
  Administered 2015-05-03: 50 ug/h via INTRAVENOUS
  Filled 2015-04-25 (×11): qty 50

## 2015-04-25 MED ORDER — MIDAZOLAM HCL 5 MG/ML IJ SOLN
0.0000 mg/h | INTRAMUSCULAR | Status: DC
  Administered 2015-04-26 – 2015-04-30 (×5): 2 mg/h via INTRAVENOUS
  Administered 2015-05-02: 0.5 mg/h via INTRAVENOUS
  Filled 2015-04-25 (×7): qty 10

## 2015-04-25 NOTE — Progress Notes (Signed)
11 Days Post-Op  Subjective: Intubated and sedated  Objective: Vital signs in last 24 hours: Temp:  [90.7 F (32.6 C)-98.4 F (36.9 C)] 98.4 F (36.9 C) (01/14 0700) Pulse Rate:  [34-100] 98 (01/14 0600) Resp:  [11-24] 24 (01/14 0600) BP: (110-129)/(53-74) 115/74 mmHg (01/14 0400) SpO2:  [91 %-100 %] 100 % (01/14 0600) Arterial Line BP: (99-149)/(53-69) 120/61 mmHg (01/14 0600) FiO2 (%):  [60 %-90 %] 60 % (01/14 0400) Weight:  [95.9 kg (211 lb 6.7 oz)] 95.9 kg (211 lb 6.7 oz) (01/14 0430) Last BM Date: 04/23/15  Intake/Output from previous day: 01/13 0701 - 01/14 0700 In: 4154.3 [I.V.:3504.3; NG/GT:90; IV Piggyback:550] Out: 1035 [Urine:1035] Intake/Output this shift:    On vent Lungs fairly clear Abdomen soft  Lab Results:   Recent Labs  04/24/15 0610 04/24/15 1608  WBC 20.2* 18.1*  HGB 8.1* 8.3*  HCT 25.4* 26.5*  PLT 278 293   BMET  Recent Labs  04/25/15 0220 04/25/15 0605  NA 146* 146*  K 4.3 4.5  CL 122* 123*  CO2 17* 18*  GLUCOSE 135* 144*  BUN 30* 30*  CREATININE 1.03 1.14  CALCIUM 8.3* 8.2*   PT/INR  Recent Labs  04/23/15 1543 04/23/15 2255  LABPROT 18.8* 15.0  INR 1.57* 1.16   ABG  Recent Labs  04/24/15 0243 04/24/15 1040  PHART 7.396 7.331*  HCO3 17.8* 18.3*    Studies/Results: Dg Chest Port 1 View  04/24/2015  CLINICAL DATA:  82 year old male with sudden drop in oxygenation. Query mucous plug. Respiratory failure with hypoxia. Initial encounter. EXAM: PORTABLE CHEST 1 VIEW COMPARISON:  Portable chest 04/24/2015 and earlier. FINDINGS: Portable AP semi upright view at 1041 hours. Stable endotracheal tube twin the level the clavicles and carina. Enteric tube course the abdomen, tip not included. Stable right PICC line. Resuscitation or pacemaker pad projects over the left upper quadrant. Dense retrocardiac opacification has mildly progressed since yesterday. Elsewhere ventilation appears stable. No pneumothorax or pulmonary edema.  Small left pleural effusion suspected. Stable cardiac size and mediastinal contours. Sequelae of CABG. Partially visible lumbar fusion hardware. IMPRESSION: 1. Dense left lower lobe collapse or consolidation has mildly progressed since yesterday and may reflect left lower lobe mucous plugging. Small left pleural effusion suspected. 2. Otherwise stable ventilation. 3.  Stable lines and tubes. Electronically Signed   By: Odessa FlemingH  Hall M.D.   On: 04/24/2015 10:54   Portable Chest Xray  04/24/2015  CLINICAL DATA:  Respiratory failure. EXAM: PORTABLE CHEST 1 VIEW COMPARISON:  04/23/2015. FINDINGS: Endotracheal tube, feeding tube, right PICC line in stable position. Prior CABG. Cardiomegaly with mild pulmonary infiltrates bilaterally suggesting congestive heart failure. Bilateral pneumonia cannot be excluded. Small pleural effusions. No pneumothorax . IMPRESSION: Prior CABG. Cardiomegaly with bilateral pulmonary infiltrates, left side greater than right. Findings suggest congestive heart failure pulmonary edema. Bilateral pneumonia cannot be excluded . Small pleural effusions are present . Electronically Signed   By: Maisie Fushomas  Register   On: 04/24/2015 07:43   Dg Chest Port 1 View  04/23/2015  CLINICAL DATA:  Acute respiratory failure, history of night valve regurgitation. Irregular heart beat. EXAM: PORTABLE CHEST 1 VIEW COMPARISON:  04/19/2015 FINDINGS: There is an endotracheal tube with the tip 3.3 cm above the carina. Feeding tube coursing below the diaphragm. Mild bilateral interstitial prominence. Trace left pleural effusion and left basilar atelectasis. No pneumothorax. There is stable cardiomegaly. There is evidence of prior CABG. The osseous structures are unremarkable. IMPRESSION: 1. Endotracheal tube with the tip 3.3 cm  above the carina. 2. Stable cardiomegaly with mild pulmonary vascular congestion. Electronically Signed   By: Elige Ko   On: 04/23/2015 15:12    Anti-infectives: Anti-infectives    Start      Dose/Rate Route Frequency Ordered Stop   04/24/15 2000  vancomycin (VANCOCIN) 1,250 mg in sodium chloride 0.9 % 250 mL IVPB     1,250 mg 166.7 mL/hr over 90 Minutes Intravenous Every 12 hours 04/24/15 0805     04/24/15 1300  ceFEPIme (MAXIPIME) 2 g in dextrose 5 % 50 mL IVPB     2 g 100 mL/hr over 30 Minutes Intravenous Every 8 hours 04/24/15 0813     04/23/15 1600  vancomycin (VANCOCIN) IVPB 750 mg/150 ml premix  Status:  Discontinued     750 mg 150 mL/hr over 60 Minutes Intravenous Every 12 hours 04/23/15 1513 04/24/15 0805   04/23/15 1530  ceFEPIme (MAXIPIME) 2 g in dextrose 5 % 50 mL IVPB  Status:  Discontinued     2 g 100 mL/hr over 30 Minutes Intravenous Every 12 hours 04/23/15 1504 04/24/15 0813   04/20/15 2200  ceFEPIme (MAXIPIME) 2 g in dextrose 5 % 50 mL IVPB  Status:  Discontinued     2 g 100 mL/hr over 30 Minutes Intravenous Every 12 hours 04/20/15 1124 04/22/15 0847   04/14/15 2230  vancomycin (VANCOCIN) IVPB 750 mg/150 ml premix  Status:  Discontinued     750 mg 150 mL/hr over 60 Minutes Intravenous Every 12 hours 04/14/15 1508 04/15/15 1130   04/14/15 1338  vancomycin (VANCOCIN) 1000 MG powder  Status:  Discontinued    Comments:  [place]cliff, Esther   : cabinet override      04/14/15 1338 04/14/15 1343   04/14/15 1213  vancomycin (VANCOCIN) powder  Status:  Discontinued       As needed 04/14/15 1214 04/14/15 1333   04/14/15 1211  vancomycin (VANCOCIN) 1000 MG powder    Comments:  Lannie Fields, Esther   : cabinet override      04/14/15 1211 04/15/15 0014   04/14/15 1147  bacitracin 50,000 Units in sodium chloride irrigation 0.9 % 500 mL irrigation  Status:  Discontinued       As needed 04/14/15 1147 04/14/15 1333   04/14/15 0600  vancomycin (VANCOCIN) IVPB 1000 mg/200 mL premix     1,000 mg 200 mL/hr over 60 Minutes Intravenous To Neuro OR-Station #32 04/13/15 1602 04/14/15 1145   04/13/15 1100  ceFEPIme (MAXIPIME) 2 g in dextrose 5 % 50 mL IVPB  Status:  Discontinued     2  g 100 mL/hr over 30 Minutes Intravenous Every 24 hours 04/13/15 1017 04/20/15 1124   04/12/15 0445  ceFAZolin (ANCEF) IVPB 1 g/50 mL premix     1 g 100 mL/hr over 30 Minutes Intravenous  Once 04/12/15 0430 04/12/15 0535      Assessment/Plan: s/p Procedure(s): POSTERIOR LUMBAR FUSION LUMBAR ONE TO LUMBAR FIVE LUMBAR;  DECOMPRESSION LAMINECTOMY LUMBAR TWO-LUMBAR FOUR (N/A)  Dr. Lindie Spruce discussed with family yesterday.  May need trach and PEG next week Continuing current care CCM  LOS: 13 days    Daizy Outen A 04/25/2015

## 2015-04-25 NOTE — Progress Notes (Signed)
Chart reviewed. Per MD- patient may need trach and PEG next week.  Current plan is for SNF when medically stable. CSW services will continue to monitor.  Lorri Frederickonna T. Jaci LazierCrowder, KentuckyLCSW [phone]  (weekend coverage)

## 2015-04-25 NOTE — Progress Notes (Signed)
eLink Physician-Brief Progress Note Patient Name: Ian KnackRobert Marks DOB: 01-Jan-1934 MRN: [id number]   Date of Service  04/25/2015  HPI/Events of Note  Agitation. Presently on a Fentanyl IV infusion at 350 mcg/hour.   eICU Interventions  Will add a Versed IV infusion. Titrate to RASS -1 to -2.     Intervention Category Minor Interventions: Agitation / anxiety - evaluation and management  Ivonne [place] Eugene 04/25/2015, 10:08 PM
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
## 2015-04-25 NOTE — Progress Notes (Signed)
Canary BrimBrandi Ollis, NP notified of pt's HR 120s. Temp 37.5 degrees C, pt opens eyes, obeys some commands. Orders received.  Will continue to monitor.

## 2015-04-26 ENCOUNTER — Inpatient Hospital Stay (HOSPITAL_COMMUNITY): Payer: No Typology Code available for payment source

## 2015-04-26 DIAGNOSIS — N179 Acute kidney failure, unspecified: Secondary | ICD-10-CM

## 2015-04-26 LAB — CBC
HEMATOCRIT: 24.1 % — AB (ref 39.0–52.0)
Hemoglobin: 7.3 g/dL — ABNORMAL LOW (ref 13.0–17.0)
MCH: 29.9 pg (ref 26.0–34.0)
MCHC: 30.3 g/dL (ref 30.0–36.0)
MCV: 98.8 fL (ref 78.0–100.0)
Platelets: 291 10*3/uL (ref 150–400)
RBC: 2.44 MIL/uL — AB (ref 4.22–5.81)
RDW: 15.5 % (ref 11.5–15.5)
WBC: 16.6 10*3/uL — AB (ref 4.0–10.5)

## 2015-04-26 LAB — BASIC METABOLIC PANEL
ANION GAP: 6 (ref 5–15)
BUN: 36 mg/dL — ABNORMAL HIGH (ref 6–20)
CALCIUM: 8.6 mg/dL — AB (ref 8.9–10.3)
CO2: 17 mmol/L — AB (ref 22–32)
Chloride: 125 mmol/L — ABNORMAL HIGH (ref 101–111)
Creatinine, Ser: 1.5 mg/dL — ABNORMAL HIGH (ref 0.61–1.24)
GFR calc Af Amer: 49 mL/min — ABNORMAL LOW (ref >60)
GFR calc non Af Amer: 43 mL/min — ABNORMAL LOW (ref >60)
GLUCOSE: 95 mg/dL (ref 65–99)
Potassium: 4.8 mmol/L (ref 3.5–5.1)
Sodium: 148 mmol/L — ABNORMAL HIGH (ref 135–145)

## 2015-04-26 LAB — CULTURE, BAL-QUANTITATIVE W GRAM STAIN

## 2015-04-26 LAB — MAGNESIUM: Magnesium: 2.3 mg/dL (ref 1.7–2.4)

## 2015-04-26 LAB — GLUCOSE, CAPILLARY
GLUCOSE-CAPILLARY: 119 mg/dL — AB (ref 65–99)
GLUCOSE-CAPILLARY: 141 mg/dL — AB (ref 65–99)
GLUCOSE-CAPILLARY: 153 mg/dL — AB (ref 65–99)
GLUCOSE-CAPILLARY: 91 mg/dL (ref 65–99)
Glucose-Capillary: 98 mg/dL (ref 65–99)
Glucose-Capillary: 162 mg/dL — ABNORMAL HIGH (ref 65–99)
Glucose-Capillary: 154 mg/dL — ABNORMAL HIGH (ref 65–99)
Glucose-Capillary: 150 mg/dL — ABNORMAL HIGH (ref 65–99)

## 2015-04-26 LAB — CULTURE, BAL-QUANTITATIVE: CULTURE: NORMAL

## 2015-04-26 LAB — CULTURE, RESPIRATORY: CULTURE: NORMAL

## 2015-04-26 LAB — CULTURE, RESPIRATORY W GRAM STAIN

## 2015-04-26 LAB — PHOSPHORUS: Phosphorus: 4.3 mg/dL (ref 2.5–4.6)

## 2015-04-26 MED ORDER — DEXTROSE 5 % IV SOLN: 2.0000 g | INTRAVENOUS | Status: DC

## 2015-04-26 MED ORDER — ALTEPLASE 2 MG IJ SOLR
2.0000 mg | Freq: Once | INTRAMUSCULAR | Status: AC
  Administered 2015-04-26: 2 mg
  Filled 2015-04-26: qty 2

## 2015-04-26 MED ORDER — DEXTROSE-NACL 5-0.45 % IV SOLN
INTRAVENOUS | Status: DC
  Administered 2015-04-26: 07:00:00 via INTRAVENOUS

## 2015-04-26 MED ORDER — PIVOT 1.5 CAL PO LIQD
1000.0000 mL | ORAL | Status: DC
  Administered 2015-04-26 – 2015-05-02 (×9): 1000 mL
  Filled 2015-04-26 (×16): qty 1000

## 2015-04-26 MED ORDER — DEXTROSE 5 % IV SOLN
2.0000 g | Freq: Two times a day (BID) | INTRAVENOUS | Status: DC
  Administered 2015-04-26 – 2015-04-30 (×8): 2 g via INTRAVENOUS
  Filled 2015-04-26 (×9): qty 2

## 2015-04-26 MED ORDER — VITAL HIGH PROTEIN PO LIQD
1000.0000 mL | ORAL | Status: DC
  Administered 2015-04-26: 1000 mL

## 2015-04-26 MED ORDER — VANCOMYCIN HCL IN DEXTROSE 750-5 MG/150ML-% IV SOLN
750.0000 mg | Freq: Two times a day (BID) | INTRAVENOUS | Status: DC
  Administered 2015-04-26 – 2015-04-27 (×2): 750 mg via INTRAVENOUS
  Filled 2015-04-26 (×3): qty 150

## 2015-04-26 MED ORDER — SODIUM CHLORIDE 0.9 % IV SOLN: Freq: Once | INTRAVENOUS | Status: AC

## 2015-04-26 MED ORDER — SODIUM BICARBONATE 8.4 % IV SOLN
INTRAVENOUS | Status: DC
  Administered 2015-04-26 – 2015-04-28 (×3): via INTRAVENOUS
  Filled 2015-04-26 (×4): qty 150
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
## 2015-04-26 NOTE — Progress Notes (Signed)
Notified MD of pts worsening myoclonic like movements when being repositioned or suctioned. No new orders placed. Will continue to monitor the pt closely.

## 2015-04-26 NOTE — Progress Notes (Signed)
eLink Physician-Brief Progress Note Patient Name: Ian KnackRobert Lensing DOB: 04/02/1934 MRN: [id number]   Date of Service  04/26/2015  HPI/Events of Note  Hypotension hgb 8.3 to 7.3  eICU Interventions  Transfuse 2 units PRBC     Intervention Category Major Interventions: Hypotension - evaluation and management  Henry RusselSMITH, Tequisha Maahs, P 04/26/2015, 6:47 AM
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
## 2015-04-26 NOTE — Progress Notes (Signed)
Pharmacy Antibiotic Note  Ian KnackRobert Lehigh is a 82 y.o. year-old male admitted on 04/12/2015. The patient is currently on day #4 of Vancomycin and Cefepime for HAP coverage.  Assessment/Plan: - PEA arrest 1/12 due to mucous plugging, pt reintubated. - UOP decreased yesterday to 1 L, SCr increased from 1.14 to 1.5. WBC improving, afebrile - Vancomycin dose reduced to 750 mg IV q12h given decrease in renal fxn - Cefepime dose reduced to 2 g IV q12h given decrease in renal fxn - Will follow renal function, culture data, progress - Will check a trough on Tuesday if therapy is to continue beyond 7 days  Temp (24hrs), Avg:98.6 F (37 C), Min:98.1 F (36.7 C), Max:99.5 F (37.5 C)   Recent Labs Lab 04/23/15 1545 04/23/15 2241 04/24/15 0610 04/24/15 1608 04/26/15 0400  WBC 11.5* 26.0* 20.2* 18.1* 16.6*     Recent Labs Lab 04/24/15 1755 04/24/15 2230 04/25/15 0220 04/25/15 0605 04/26/15 0400  CREATININE 0.87 0.92 1.03 1.14 1.50*   Estimated Creatinine Clearance: 47.2 mL/min (by C-G formula based on Cr of 1.5).    Allergies  Allergen Reactions  . Bee Venom Anaphylaxis    Allergic reaction-anaphylaxis? Has epipen  . Morphine And Related Anxiety    Family requested Morphine be not given because of pt having extreme agitation and anxiety.  [name]. Levaquin [Levofloxacin In D5w] Rash  . Levofloxacin Rash  . Lipitor [Atorvastatin] Other (See Comments)  . Penicillins Hives    Tolerates cefepime  . Zithromax [Azithromycin] Other (See Comments)    Antimicrobials this admission:  Vanc 1/3 >> 1/4: restarted 1/12>>  Cefepime 1/2 >> 1/11; restarted 1/12>>  Levels/dose changes this admission: 1/13 Vanc increased to 1250 mg q12h from 750 mg q12h 1/13 Cefepime increased to 2 g IV q8h from 2 g q12h 1/15 Vancomycin reduced to 750 mg IV q12h from 1250 q12h 1/15 Cefepime reduced to 2 g IV q12h from 2 g IV q8h  Microbiology results: 1/3 resp cx - normal flora 1/1 MRSA PCR neg 1/12- trach  aspirate - moderate GPC in pairs/clusters on stain - reincubated 1/12 BAL - normal flora  Thank you for allowing pharmacy to be a part of this patient's care. Please contact us if you have questions.  Arcola JanskyMeagan Marlette Curvin, PharmD Clinical Pharmacy Resident Pager: [phone]  04/26/2015 8:22 AM

## 2015-04-26 NOTE — Progress Notes (Signed)
Wasted approximately 30ml of IV versed in sink due to med expired at 0100. Witnessed by Fluor CorporationShanna RN
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
## 2015-04-27 ENCOUNTER — Inpatient Hospital Stay (HOSPITAL_COMMUNITY): Payer: No Typology Code available for payment source

## 2015-04-27 ENCOUNTER — Encounter (HOSPITAL_COMMUNITY): Payer: Self-pay | Admitting: Neurology

## 2015-04-27 DIAGNOSIS — R579 Shock, unspecified: Secondary | ICD-10-CM

## 2015-04-27 DIAGNOSIS — G931 Anoxic brain damage, not elsewhere classified: Secondary | ICD-10-CM | POA: Insufficient documentation

## 2015-04-27 LAB — GLUCOSE, CAPILLARY
GLUCOSE-CAPILLARY: 316 mg/dL — AB (ref 65–99)
GLUCOSE-CAPILLARY: 151 mg/dL — AB (ref 65–99)
Glucose-Capillary: 119 mg/dL — ABNORMAL HIGH (ref 65–99)
Glucose-Capillary: 143 mg/dL — ABNORMAL HIGH (ref 65–99)
Glucose-Capillary: 146 mg/dL — ABNORMAL HIGH (ref 65–99)
Glucose-Capillary: 142 mg/dL — ABNORMAL HIGH (ref 65–99)

## 2015-04-27 LAB — MAGNESIUM: MAGNESIUM: 2 mg/dL (ref 1.7–2.4)

## 2015-04-27 LAB — TYPE AND SCREEN
ABO/RH(D): A POS
ANTIBODY SCREEN: NEGATIVE
UNIT DIVISION: 0
Unit division: 0
Unit division: 0
Unit division: 0

## 2015-04-27 LAB — POCT I-STAT 3, ART BLOOD GAS (G3+)
ACID-BASE DEFICIT: 4 mmol/L — AB (ref 0.0–2.0)
Bicarbonate: 19.6 meq/L — ABNORMAL LOW (ref 20.0–24.0)
O2 SAT: 97 %
PCO2 ART: 28.6 mmHg — AB (ref 35.0–45.0)
PH ART: 7.445 (ref 7.350–7.450)
Patient temperature: 37
TCO2: 20 mmol/L (ref 0–100)
pO2, Arterial: 90 mmHg (ref 80.0–100.0)

## 2015-04-27 LAB — BASIC METABOLIC PANEL
ANION GAP: 3 — AB (ref 5–15)
BUN: 32 mg/dL — ABNORMAL HIGH (ref 6–20)
CALCIUM: 8.2 mg/dL — AB (ref 8.9–10.3)
CO2: 24 mmol/L (ref 22–32)
Chloride: 121 mmol/L — ABNORMAL HIGH (ref 101–111)
Creatinine, Ser: 1.42 mg/dL — ABNORMAL HIGH (ref 0.61–1.24)
GFR, EST AFRICAN AMERICAN: 53 mL/min — AB (ref >60)
GFR, EST NON AFRICAN AMERICAN: 45 mL/min — AB (ref >60)
Glucose, Bld: 221 mg/dL — ABNORMAL HIGH (ref 65–99)
Potassium: 3.6 mmol/L (ref 3.5–5.1)
SODIUM: 148 mmol/L — AB (ref 135–145)

## 2015-04-27 LAB — CBC
HCT: 25.9 % — ABNORMAL LOW (ref 39.0–52.0)
HEMOGLOBIN: 8.1 g/dL — AB (ref 13.0–17.0)
MCH: 28.9 pg (ref 26.0–34.0)
MCHC: 31.3 g/dL (ref 30.0–36.0)
MCV: 92.5 fL (ref 78.0–100.0)
Platelets: 284 10*3/uL (ref 150–400)
RBC: 2.8 MIL/uL — AB (ref 4.22–5.81)
RDW: 18.1 % — ABNORMAL HIGH (ref 11.5–15.5)
WBC: 10.1 10*3/uL (ref 4.0–10.5)

## 2015-04-27 MED ORDER — FREE WATER
300.0000 mL | Freq: Four times a day (QID) | Status: DC
  Administered 2015-04-27 – 2015-04-30 (×12): 300 mL

## 2015-04-27 NOTE — Progress Notes (Signed)
CSW continues to follow for dc planning- patient intubated at this time. No current CSW needs- will follow.  Reece Levy, MSW, Theresia Majors  [phone]

## 2015-04-27 NOTE — Procedures (Signed)
History: 82 year old male status post cardiac arrest  Sedation: Fentanyl and Versed  Technique: This is a 21 channel routine scalp EEG performed at the bedside with bipolar and monopolar montages arranged in accordance to the international 10/20 system of electrode placement. One channel was dedicated to EKG recording.    Background: The background consists of generalized irregular delta and theta activities. There are occasional runs of generalized alpha activity resembling sleep spindles. There are bifrontal discharges with triphasic morphology that occur occasionally, not periodically.  Photic stimulation: Physiologic driving is not performed  EEG Abnormalities: 1) triphasic waves 2) generalized irregular slow activity  Clinical Interpretation: This EEG is consistent with a generalized nonspecific cerebral dysfunction (encephalopathy). There is no clear seizure or seizure predisposition recorded on this study. Please note that a normal EEG does not preclude the possibility of epilepsy.   Ritta SlotMcNeill Olusegun Gerstenberger, MD [facility] [phone]  If 7pm- 7am, please page neurology on call as listed in AMION.
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
## 2015-04-27 NOTE — Progress Notes (Signed)
PT Cancellation Note and discharge  Patient Details Name: Ian KnackRobert Letts MRN: [id number] DOB: 09-14-1933   Cancelled Treatment:    Reason Eval/Treat Not Completed: Medical issues which prohibited therapy;Patient not medically ready; patient s/pPEA arrest and intubated and sedated after rewarming following cooling protocol.  Will sign off and await new order when medically ready for PT.   Elray McgregorCynthia Kenyonna Micek 04/27/2015, 11:41 AM  Sheran Lawlessyndi Jaidan Stachnik, PT [phone] 04/27/2015
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
## 2015-04-27 NOTE — Progress Notes (Signed)
EEG completed; results pending.    

## 2015-04-27 NOTE — Progress Notes (Signed)
SLP Cancellation Note  Patient Details Name: Ervin KnackRobert Chico MRN: [id number] DOB: Mar 02, 1934   Cancelled treatment:       Reason Eval/Treat Not Completed: Medical issues which prohibited therapy. Pt sedated and on ventilator. Will continue to follow.   Maxcine HamLaura Paiewonsky, M.A. CCC-SLP [phone]  Maxcine Hamaiewonsky, Sophea Rackham 04/27/2015, 8:20 AM
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
## 2015-04-28 ENCOUNTER — Inpatient Hospital Stay (HOSPITAL_COMMUNITY): Payer: No Typology Code available for payment source

## 2015-04-28 LAB — BASIC METABOLIC PANEL
ANION GAP: 9 (ref 5–15)
BUN: 35 mg/dL — ABNORMAL HIGH (ref 6–20)
CALCIUM: 8.6 mg/dL — AB (ref 8.9–10.3)
CHLORIDE: 116 mmol/L — AB (ref 101–111)
CO2: 23 mmol/L (ref 22–32)
Creatinine, Ser: 1.28 mg/dL — ABNORMAL HIGH (ref 0.61–1.24)
GFR calc non Af Amer: 52 mL/min — ABNORMAL LOW (ref >60)
GFR, EST AFRICAN AMERICAN: 60 mL/min — AB (ref >60)
Glucose, Bld: 130 mg/dL — ABNORMAL HIGH (ref 65–99)
Potassium: 3.7 mmol/L (ref 3.5–5.1)
SODIUM: 148 mmol/L — AB (ref 135–145)

## 2015-04-28 LAB — CBC
HEMATOCRIT: 29.5 % — AB (ref 39.0–52.0)
HEMOGLOBIN: 9.2 g/dL — AB (ref 13.0–17.0)
MCH: 28.9 pg (ref 26.0–34.0)
MCHC: 31.2 g/dL (ref 30.0–36.0)
MCV: 92.8 fL (ref 78.0–100.0)
Platelets: 267 10*3/uL (ref 150–400)
RBC: 3.18 MIL/uL — ABNORMAL LOW (ref 4.22–5.81)
RDW: 17.5 % — AB (ref 11.5–15.5)
WBC: 6.5 10*3/uL (ref 4.0–10.5)

## 2015-04-28 LAB — GLUCOSE, CAPILLARY
GLUCOSE-CAPILLARY: 135 mg/dL — AB (ref 65–99)
GLUCOSE-CAPILLARY: 126 mg/dL — AB (ref 65–99)
GLUCOSE-CAPILLARY: 113 mg/dL — AB (ref 65–99)
Glucose-Capillary: 139 mg/dL — ABNORMAL HIGH (ref 65–99)
Glucose-Capillary: 137 mg/dL — ABNORMAL HIGH (ref 65–99)
Glucose-Capillary: 146 mg/dL — ABNORMAL HIGH (ref 65–99)

## 2015-04-28 LAB — PATHOLOGIST SMEAR REVIEW

## 2015-04-28 LAB — POCT I-STAT 3, ART BLOOD GAS (G3+)
ACID-BASE DEFICIT: 2 mmol/L (ref 0.0–2.0)
BICARBONATE: 21.5 meq/L (ref 20.0–24.0)
O2 SAT: 94 %
PO2 ART: 69 mmHg — AB (ref 80.0–100.0)
Patient temperature: 99.4
TCO2: 22 mmol/L (ref 0–100)
pCO2 arterial: 31.6 mmHg — ABNORMAL LOW (ref 35.0–45.0)
pH, Arterial: 7.442 (ref 7.350–7.450)

## 2015-04-28 LAB — PHOSPHORUS: Phosphorus: 2.5 mg/dL (ref 2.5–4.6)

## 2015-04-28 LAB — MAGNESIUM: Magnesium: 1.9 mg/dL (ref 1.7–2.4)

## 2015-04-28 MED ORDER — SENNOSIDES-DOCUSATE SODIUM 8.6-50 MG PO TABS
2.0000 | ORAL_TABLET | Freq: Two times a day (BID) | ORAL | Status: DC
  Administered 2015-04-28 – 2015-05-20 (×28): 2 via ORAL
  Filled 2015-04-28 (×36): qty 2

## 2015-04-28 MED ORDER — ALBUMIN HUMAN 25 % IV SOLN
25.0000 g | Freq: Four times a day (QID) | INTRAVENOUS | Status: AC
  Administered 2015-04-28 – 2015-04-29 (×4): 25 g via INTRAVENOUS
  Filled 2015-04-28 (×4): qty 100

## 2015-04-28 MED ORDER — FUROSEMIDE 10 MG/ML IJ SOLN
20.0000 mg | Freq: Once | INTRAMUSCULAR | Status: AC
  Administered 2015-04-28: 20 mg via INTRAVENOUS
  Filled 2015-04-28: qty 2

## 2015-04-28 MED ORDER — DEXTROSE 5 % IV SOLN
8.0000 mg/h | INTRAVENOUS | Status: AC
  Administered 2015-04-28: 8 mg/h via INTRAVENOUS
  Filled 2015-04-28: qty 25

## 2015-04-28 MED ORDER — MAGNESIUM HYDROXIDE 400 MG/5ML PO SUSP
30.0000 mL | Freq: Every day | ORAL | Status: DC
  Administered 2015-04-28 – 2015-05-20 (×15): 30 mL
  Filled 2015-04-28 (×18): qty 30

## 2015-04-28 MED ORDER — METOLAZONE 5 MG PO TABS
5.0000 mg | ORAL_TABLET | Freq: Once | ORAL | Status: AC
  Administered 2015-04-28: 5 mg
  Filled 2015-04-28: qty 1

## 2015-04-28 NOTE — Progress Notes (Signed)
Patient ID: [name], male   DOB: 11-Apr-1934, 82 y.o.   MRN: [id number] Came to the bedside and removed sutures from both his feet.  Lacerations on the lateral side of the right foot and the bottom of his left foot look good.  Steri-strips applied.  No further treatment needed.  Left fibula shaft fracture stable and no further treatment needed.

## 2015-04-28 NOTE — Progress Notes (Signed)
Occupational Therapy Discharge Patient Details Name: Keita Valley MRN: [id number] DOB: 1933-11-23 Today's Date: 04/28/2015 Time:  -     Patient discharged from OT services secondary to medical decline - will need to re-order OT to resume therapy services. Pt currently intubated s/p PEA per chart. Ot will await reorder for most appropriate time to reevaluate.   Please see latest therapy progress note for current level of functioning and progress toward goals.    Progress and discharge plan discussed with patient and/or caregiver: Patient unable to participate in discharge planning and no caregivers available  GO     Felecia Shelling   OTR/L Pager: [phone] Office: [phone] .  04/28/2015, 8:15 AM
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
## 2015-04-28 NOTE — Progress Notes (Signed)
abg collected  

## 2015-04-28 NOTE — Progress Notes (Signed)
Son's FMLA paperwork completed and placed in front of chart.  Bedside nurse states she will call son and let him know it is there, so he can pick up during next visit.    Quintella Baton, RN, BSN  Trauma/Neuro ICU Case Manager [phone]

## 2015-04-28 NOTE — Clinical Documentation Improvement (Signed)
Critical Care  Can the diagnosis of Shock be further specified?   Shock, including Type:  Septic, Cardiogenic, Hyper/Hypoglycemic, Hypovolemic, Hemorrhagic, Neurogenic, Anaphylactic, Other type, including suspected or known cause and/or associated condition(s)  Other  Clinically Undetermined  Document any associated diagnoses/conditions. Please update your documentation within the medical record to reflect your response to this query. Thank you.  Supporting Information: "He continues to have shock and now has evidence for acute renal injury. "  Please exercise your independent, professional judgment when responding. A specific answer is not anticipated or expected.  Thank You, Nevin Bloodgood, RN, BSN, CCDS,Clinical Documentation Specialist:  [phone]  [phone]=[facility] [employer] [facility]- Health Information Management

## 2015-04-28 NOTE — Progress Notes (Signed)
Speech Language Pathology Discharge Patient Details Name: [name] MRN: [id number] DOB: 26-Nov-1933 Today's Date: 04/28/2015 Time: 9:58 AM   Patient discharged from SLP services secondary to medical decline - will need to re-order SLP to resume therapy services. Pt sedated and on vent. RN says may be trached and SLP may see as part of trach team.  Please see latest therapy progress note for current level of functioning and progress toward goals.    Progress and discharge plan discussed with patient and/or caregiver: Patient unable to participate in discharge planning and no caregivers available  GO     Kemal Amores Legrand Rams, Student-SLP   04/28/2015, 9:57 AM
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
## 2015-04-29 ENCOUNTER — Inpatient Hospital Stay (HOSPITAL_COMMUNITY): Payer: No Typology Code available for payment source

## 2015-04-29 LAB — CBC
HCT: 26.8 % — ABNORMAL LOW (ref 39.0–52.0)
Hemoglobin: 8.3 g/dL — ABNORMAL LOW (ref 13.0–17.0)
MCH: 29 pg (ref 26.0–34.0)
MCHC: 31 g/dL (ref 30.0–36.0)
MCV: 93.7 fL (ref 78.0–100.0)
PLATELETS: 283 10*3/uL (ref 150–400)
RBC: 2.86 MIL/uL — ABNORMAL LOW (ref 4.22–5.81)
RDW: 17.1 % — AB (ref 11.5–15.5)
WBC: 6.3 10*3/uL (ref 4.0–10.5)

## 2015-04-29 LAB — PHOSPHORUS: Phosphorus: 3.6 mg/dL (ref 2.5–4.6)

## 2015-04-29 LAB — BLOOD GAS, ARTERIAL
Acid-Base Excess: 4.7 mmol/L — ABNORMAL HIGH (ref 0.0–2.0)
Bicarbonate: 27.1 mEq/L — ABNORMAL HIGH (ref 20.0–24.0)
DRAWN BY: 348601
FIO2: 0.4
LHR: 24 {breaths}/min
MECHVT: 0.6 mL
O2 SAT: 95.7 %
PATIENT TEMPERATURE: 98.6
PCO2 ART: 29.9 mmHg — AB (ref 35.0–45.0)
PEEP: 8 cmH2O
PO2 ART: 70.7 mmHg — AB (ref 80.0–100.0)
TCO2: 28.1 mmol/L (ref 0–100)
pH, Arterial: 7.565 — ABNORMAL HIGH (ref 7.350–7.450)

## 2015-04-29 LAB — BASIC METABOLIC PANEL
Anion gap: 8 (ref 5–15)
BUN: 44 mg/dL — AB (ref 6–20)
CALCIUM: 8.8 mg/dL — AB (ref 8.9–10.3)
CO2: 29 mmol/L (ref 22–32)
Chloride: 107 mmol/L (ref 101–111)
Creatinine, Ser: 1.61 mg/dL — ABNORMAL HIGH (ref 0.61–1.24)
GFR calc Af Amer: 45 mL/min — ABNORMAL LOW (ref >60)
GFR, EST NON AFRICAN AMERICAN: 39 mL/min — AB (ref >60)
GLUCOSE: 184 mg/dL — AB (ref 65–99)
Potassium: 2.9 mmol/L — ABNORMAL LOW (ref 3.5–5.1)
SODIUM: 144 mmol/L (ref 135–145)

## 2015-04-29 LAB — GLUCOSE, CAPILLARY
GLUCOSE-CAPILLARY: 125 mg/dL — AB (ref 65–99)
GLUCOSE-CAPILLARY: 138 mg/dL — AB (ref 65–99)
GLUCOSE-CAPILLARY: 141 mg/dL — AB (ref 65–99)
GLUCOSE-CAPILLARY: 154 mg/dL — AB (ref 65–99)
GLUCOSE-CAPILLARY: 162 mg/dL — AB (ref 65–99)
Glucose-Capillary: 143 mg/dL — ABNORMAL HIGH (ref 65–99)

## 2015-04-29 LAB — MAGNESIUM: MAGNESIUM: 1.7 mg/dL (ref 1.7–2.4)

## 2015-04-29 MED ORDER — FUROSEMIDE 10 MG/ML IJ SOLN
40.0000 mg | Freq: Three times a day (TID) | INTRAMUSCULAR | Status: AC
  Administered 2015-04-29 (×2): 40 mg via INTRAVENOUS
  Filled 2015-04-29 (×2): qty 4

## 2015-04-29 MED ORDER — FLEET ENEMA 7-19 GM/118ML RE ENEM
1.0000 | ENEMA | Freq: Once | RECTAL | Status: AC
  Administered 2015-04-29: 1 via RECTAL
  Filled 2015-04-29: qty 1

## 2015-04-29 MED ORDER — POTASSIUM CHLORIDE 20 MEQ/15ML (10%) PO SOLN
40.0000 meq | Freq: Once | ORAL | Status: AC
  Administered 2015-04-29: 40 meq
  Filled 2015-04-29: qty 30

## 2015-04-29 MED ORDER — POTASSIUM CHLORIDE 20 MEQ/15ML (10%) PO SOLN
40.0000 meq | Freq: Four times a day (QID) | ORAL | Status: AC
  Administered 2015-04-29 (×2): 40 meq via ORAL
  Filled 2015-04-29 (×2): qty 30

## 2015-04-29 MED ORDER — MAGNESIUM SULFATE 2 GM/50ML IV SOLN
2.0000 g | Freq: Once | INTRAVENOUS | Status: AC
  Administered 2015-04-29: 2 g via INTRAVENOUS
  Filled 2015-04-29: qty 50

## 2015-04-29 MED FILL — Medication: Qty: 1 | Status: AC

## 2015-04-29 NOTE — Procedures (Signed)
Arterial Catheter Insertion Procedure Note Collier Bohnet [id number] 1933/11/17  Procedure: Insertion of Arterial Catheter  Indications: Blood pressure monitoring and Frequent blood sampling  Procedure Details Consent: Unable to obtain consent because of emergent medical necessity. Time Out: Verified patient identification, verified procedure, site/side was marked, verified correct patient position, special equipment/implants available, medications/allergies/relevent history reviewed, required imaging and test results available.  Performed  Maximum sterile technique was used including antiseptics, cap, gloves, hand hygiene and mask. Skin prep: Chlorhexidine; local anesthetic administered 20 gauge catheter was inserted into right radial artery using the Seldinger technique.  Evaluation Blood flow good; BP tracing good. Complications: No apparent complications.   Koren Bound 04/29/2015

## 2015-04-29 NOTE — Clinical Social Work Note (Signed)
Clinical Social Worker continuing to follow patient and family for support and discharge planning needs.  Patient currently remains intubated with family discussion regarding trach/PEG placement.  Patient wife is currently at [facility] and plan prior to patient medical decline was also Radiographer, therapeutic.  CSW remains available for patient and family support and to provide guidance with discharge planning needs once appropriate.  Macario Golds, [place] [phone]
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
## 2015-04-29 NOTE — Progress Notes (Addendum)
20mL of IV versed wasted in sink with 2 RNs witnessing. Malachi Pro RN and Marvel Plan.
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
## 2015-04-30 ENCOUNTER — Inpatient Hospital Stay (HOSPITAL_COMMUNITY): Payer: No Typology Code available for payment source

## 2015-04-30 LAB — POCT I-STAT 3, ART BLOOD GAS (G3+)
ACID-BASE EXCESS: 11 mmol/L — AB (ref 0.0–2.0)
Acid-Base Excess: 10 mmol/L — ABNORMAL HIGH (ref 0.0–2.0)
BICARBONATE: 32.7 meq/L — AB (ref 20.0–24.0)
Bicarbonate: 33.5 meq/L — ABNORMAL HIGH (ref 20.0–24.0)
O2 SAT: 97 %
O2 SAT: 98 %
PCO2 ART: 38.9 mmHg (ref 35.0–45.0)
PH ART: 7.544 — AB (ref 7.350–7.450)
PO2 ART: 81 mmHg (ref 80.0–100.0)
PO2 ART: 89 mmHg (ref 80.0–100.0)
Patient temperature: 99.4
TCO2: 34 mmol/L (ref 0–100)
TCO2: 35 mmol/L (ref 0–100)
pCO2 arterial: 33.2 mmHg — ABNORMAL LOW (ref 35.0–45.0)
pH, Arterial: 7.605 (ref 7.350–7.450)

## 2015-04-30 LAB — MAGNESIUM: Magnesium: 2.1 mg/dL (ref 1.7–2.4)

## 2015-04-30 LAB — BASIC METABOLIC PANEL
ANION GAP: 1 — AB (ref 5–15)
BUN: 54 mg/dL — ABNORMAL HIGH (ref 6–20)
CALCIUM: 8 mg/dL — AB (ref 8.9–10.3)
CO2: 27 mmol/L (ref 22–32)
Chloride: 102 mmol/L (ref 101–111)
Creatinine, Ser: 1.84 mg/dL — ABNORMAL HIGH (ref 0.61–1.24)
GFR, EST AFRICAN AMERICAN: 38 mL/min — AB (ref >60)
GFR, EST NON AFRICAN AMERICAN: 33 mL/min — AB (ref >60)
Glucose, Bld: 529 mg/dL — ABNORMAL HIGH (ref 65–99)
POTASSIUM: 3.5 mmol/L (ref 3.5–5.1)
Sodium: 130 mmol/L — ABNORMAL LOW (ref 135–145)

## 2015-04-30 LAB — CBC
HCT: 27.4 % — ABNORMAL LOW (ref 39.0–52.0)
Hemoglobin: 8.4 g/dL — ABNORMAL LOW (ref 13.0–17.0)
MCH: 28.5 pg (ref 26.0–34.0)
MCHC: 30.7 g/dL (ref 30.0–36.0)
MCV: 92.9 fL (ref 78.0–100.0)
PLATELETS: 276 10*3/uL (ref 150–400)
RBC: 2.95 MIL/uL — AB (ref 4.22–5.81)
RDW: 16.5 % — AB (ref 11.5–15.5)
WBC: 6.1 10*3/uL (ref 4.0–10.5)

## 2015-04-30 LAB — GLUCOSE, CAPILLARY
GLUCOSE-CAPILLARY: 148 mg/dL — AB (ref 65–99)
GLUCOSE-CAPILLARY: 157 mg/dL — AB (ref 65–99)
GLUCOSE-CAPILLARY: 91 mg/dL (ref 65–99)
Glucose-Capillary: 138 mg/dL — ABNORMAL HIGH (ref 65–99)
Glucose-Capillary: 139 mg/dL — ABNORMAL HIGH (ref 65–99)
Glucose-Capillary: 162 mg/dL — ABNORMAL HIGH (ref 65–99)

## 2015-04-30 LAB — PHOSPHORUS: PHOSPHORUS: 3.6 mg/dL (ref 2.5–4.6)

## 2015-04-30 MED ORDER — FUROSEMIDE 10 MG/ML IJ SOLN
40.0000 mg | Freq: Once | INTRAMUSCULAR | Status: AC
  Administered 2015-04-30: 40 mg via INTRAVENOUS
  Filled 2015-04-30: qty 4

## 2015-04-30 MED ORDER — POTASSIUM CHLORIDE 20 MEQ/15ML (10%) PO SOLN
40.0000 meq | Freq: Once | ORAL | Status: AC
  Administered 2015-04-30: 40 meq
  Filled 2015-04-30: qty 30

## 2015-04-30 MED ORDER — CEFEPIME HCL 2 G IJ SOLR
2.0000 g | INTRAMUSCULAR | Status: AC
  Administered 2015-05-01 – 2015-05-02 (×2): 2 g via INTRAVENOUS
  Filled 2015-04-30 (×2): qty 2

## 2015-04-30 NOTE — Care Management (Signed)
UR updated.  

## 2015-04-30 NOTE — Progress Notes (Signed)
Patient ID: [name], male   DOB: 1933/05/06, 82 y.o.   MRN: [id number] F/U ABG shows some improvement in alkalosis, PCO2 into normal range with temp correction. May need diamox tomorrow. Placed on CPAP/PS with RT. Long periods of apnea with few tiny breaths. May remain vent dependent. Violeta Gelinas, MD, MPH, FACS Trauma: [phone] General Surgery: [phone]

## 2015-04-30 NOTE — Progress Notes (Signed)
Pharmacy Antibiotic Follow-up Note  [name] is a 82 y.o. year-old male admitted on 04/12/2015.  The patient is currently on day 8 of Cefepime for possible HCAP vs aspiration.  Assessment/Plan: Given patient's declining renal fxn will decrease dose to Cefepime 2G Q24 WBC improving on therapy. Afeb. Will continue to follow renal function, LOT, and antibiotic de-escalation plans   Temp (24hrs), Avg:99.5 F (37.5 C), Min:98.7 F (37.1 C), Max:101.4 F (38.6 C)   Recent Labs Lab 04/26/15 0400 04/27/15 0430 04/28/15 0420 04/29/15 0639 04/30/15 0430  WBC 16.6* 10.1 6.5 6.3 6.1     Recent Labs Lab 04/26/15 0400 04/27/15 0430 04/28/15 0420 04/29/15 0639 04/30/15 0430  CREATININE 1.50* 1.42* 1.28* 1.61* 1.84*   Estimated Creatinine Clearance: 37.7 mL/min (by C-G formula based on Cr of 1.84).    Allergies  Allergen Reactions  . Bee Venom Anaphylaxis    Allergic reaction-anaphylaxis? Has epipen  . Morphine And Related Anxiety    Family requested Morphine be not given because of pt having extreme agitation and anxiety.  [name] [Levofloxacin In D5w] Rash  . Levofloxacin Rash  . Lipitor [Atorvastatin] Other (See Comments)  . Penicillins Hives    Tolerates cefepime  . Zithromax [Azithromycin] Other (See Comments)    Antimicrobials this admission: 1/5 Cefepime >> 1/10; 1/12>>  1/3 Vanc >> 1/4; 1/12>>1/16  Levels/dose changes this admission: N/A  Microbiology results: 1/1 MRSA PCR neg  1/9 trach aspirate- normal flora final  1/12- trach aspirate - normal flora final  1/12 BAL - normal flora final  Thank you for allowing pharmacy to be a part of this patient's care.  Remi Haggard PharmD 04/30/2015 1:47 PM
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
## 2015-04-30 NOTE — Progress Notes (Signed)
[facility] ADULT ICU REPLACEMENT PROTOCOL FOR AM LAB REPLACEMENT ONLY  The patient does not apply for the [facility] Adult ICU Electrolyte Replacment Protocol based on the criteria listed below:    Is GFR >/= 40 ml/min? No.  Patient's GFR today is 33   Abnormal electrolyte(s):K3.5  6. If a panic level lab has been reported, has the CCM MD in charge been notified? Yes.  .   Physician:  Robbie Lis, MD  Melrose Nakayama 04/30/2015 6:19 AM

## 2015-04-30 NOTE — Progress Notes (Signed)
CRITICAL VALUE ALERT  Critical value received:  APTT  Date of notification: 04/30/2015   Time of notification:  6:19 AM   Critical value read back:yes  Nurse who received alert:  Florence Canner, RN  MD notified (1st page):  Pola Corn (Dr. Arsenio Loader, MD)   Time of first page:  6:19 AM   Responding MD:  Pola Corn  Time MD responded: 6:20 AM

## 2015-04-30 NOTE — Progress Notes (Signed)
Unopened Fentanyl Drip walked back to pharmacy per Donato Schultz, RN. Milon Dikes

## 2015-05-01 LAB — CBC
HEMATOCRIT: 32.3 % — AB (ref 39.0–52.0)
Hemoglobin: 9.4 g/dL — ABNORMAL LOW (ref 13.0–17.0)
MCH: 28.2 pg (ref 26.0–34.0)
MCHC: 29.1 g/dL — ABNORMAL LOW (ref 30.0–36.0)
MCV: 97 fL (ref 78.0–100.0)
Platelets: 283 10*3/uL (ref 150–400)
RBC: 3.33 MIL/uL — ABNORMAL LOW (ref 4.22–5.81)
RDW: 16.6 % — AB (ref 11.5–15.5)
WBC: 8.6 10*3/uL (ref 4.0–10.5)

## 2015-05-01 LAB — BLOOD GAS, ARTERIAL
Acid-Base Excess: 6.4 mmol/L — ABNORMAL HIGH (ref 0.0–2.0)
BICARBONATE: 29.6 meq/L — AB (ref 20.0–24.0)
Drawn by: 24487
FIO2: 0.4
LHR: 20 {breaths}/min
O2 Saturation: 94 %
PATIENT TEMPERATURE: 98.6
PEEP: 5 cmH2O
TCO2: 30.8 mmol/L (ref 0–100)
VT: 600 mL
pCO2 arterial: 37.1 mmHg (ref 35.0–45.0)
pH, Arterial: 7.514 — ABNORMAL HIGH (ref 7.350–7.450)
pO2, Arterial: 73.4 mmHg — ABNORMAL LOW (ref 80.0–100.0)

## 2015-05-01 LAB — BASIC METABOLIC PANEL
ANION GAP: 12 (ref 5–15)
BUN: 65 mg/dL — ABNORMAL HIGH (ref 6–20)
CALCIUM: 8.1 mg/dL — AB (ref 8.9–10.3)
CO2: 27 mmol/L (ref 22–32)
Chloride: 107 mmol/L (ref 101–111)
Creatinine, Ser: 1.74 mg/dL — ABNORMAL HIGH (ref 0.61–1.24)
GFR, EST AFRICAN AMERICAN: 41 mL/min — AB (ref >60)
GFR, EST NON AFRICAN AMERICAN: 36 mL/min — AB (ref >60)
Glucose, Bld: 135 mg/dL — ABNORMAL HIGH (ref 65–99)
Potassium: 4 mmol/L (ref 3.5–5.1)
Sodium: 146 mmol/L — ABNORMAL HIGH (ref 135–145)

## 2015-05-01 LAB — GLUCOSE, CAPILLARY
GLUCOSE-CAPILLARY: 138 mg/dL — AB (ref 65–99)
GLUCOSE-CAPILLARY: 152 mg/dL — AB (ref 65–99)
Glucose-Capillary: 162 mg/dL — ABNORMAL HIGH (ref 65–99)
Glucose-Capillary: 147 mg/dL — ABNORMAL HIGH (ref 65–99)
Glucose-Capillary: 144 mg/dL — ABNORMAL HIGH (ref 65–99)
Glucose-Capillary: 129 mg/dL — ABNORMAL HIGH (ref 65–99)

## 2015-05-01 MED ORDER — ACETAZOLAMIDE SODIUM 500 MG IJ SOLR
250.0000 mg | Freq: Two times a day (BID) | INTRAMUSCULAR | Status: AC
  Administered 2015-05-01 (×2): 250 mg via INTRAVENOUS
  Filled 2015-05-01 (×2): qty 500

## 2015-05-01 MED ORDER — FREE WATER
200.0000 mL | Freq: Three times a day (TID) | Status: DC
  Administered 2015-05-01 – 2015-05-02 (×5): 200 mL
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
## 2015-05-01 NOTE — Clinical Social Work Note (Signed)
Clinical Social Worker continuing to follow patient and family for support and discharge planning needs.  Patient remains on vent support at this time and no family present at bedside.  CSW contacted patient son, [name], over the phone who states that he is currently at [place] with patient wife to discuss patient potential long term needs.  Patient wife is in agreement with trach placement if needed.  CSW provided information and guidance on the possibility for the need of LTAC or vent SNF placement if patient is unable to wean.  Patient son verbalized understanding.  CSW did not further explain out of state vent SNF options yet, but did prepare family for placement out of the immediate area.  Patient with a very supportive family and hopeful for CIR vs. LTAC at discharge.  CSW remains available for support and to facilitate patient discharge needs once medically stable.  Macario Golds, [place] [phone]

## 2015-05-01 NOTE — Progress Notes (Signed)
25 ml of versed wasted in sink. Witnessed by Isabella Bowens, RN and Malachi Pro, RN.

## 2015-05-02 ENCOUNTER — Inpatient Hospital Stay (HOSPITAL_COMMUNITY): Payer: No Typology Code available for payment source

## 2015-05-02 LAB — BLOOD GAS, ARTERIAL
ACID-BASE DEFICIT: 0.1 mmol/L (ref 0.0–2.0)
BICARBONATE: 23.9 meq/L (ref 20.0–24.0)
Drawn by: 252031
FIO2: 0.6
O2 SAT: 98.1 %
PCO2 ART: 38 mmHg (ref 35.0–45.0)
PEEP: 5 cmH2O
Patient temperature: 98.4
RATE: 20 resp/min
TCO2: 25.1 mmol/L (ref 0–100)
VT: 600 mL
pH, Arterial: 7.415 (ref 7.350–7.450)
pO2, Arterial: 117 mmHg — ABNORMAL HIGH (ref 80.0–100.0)

## 2015-05-02 LAB — BASIC METABOLIC PANEL
Anion gap: 7 (ref 5–15)
BUN: 73 mg/dL — ABNORMAL HIGH (ref 6–20)
CHLORIDE: 109 mmol/L (ref 101–111)
CO2: 23 mmol/L (ref 22–32)
CREATININE: 1.7 mg/dL — AB (ref 0.61–1.24)
Calcium: 8.7 mg/dL — ABNORMAL LOW (ref 8.9–10.3)
GFR calc non Af Amer: 37 mL/min — ABNORMAL LOW (ref >60)
GFR, EST AFRICAN AMERICAN: 42 mL/min — AB (ref >60)
Glucose, Bld: 336 mg/dL — ABNORMAL HIGH (ref 65–99)
POTASSIUM: 4.1 mmol/L (ref 3.5–5.1)
Sodium: 139 mmol/L (ref 135–145)

## 2015-05-02 LAB — CBC
HEMATOCRIT: 31.8 % — AB (ref 39.0–52.0)
HEMOGLOBIN: 9.2 g/dL — AB (ref 13.0–17.0)
MCH: 28.9 pg (ref 26.0–34.0)
MCHC: 28.9 g/dL — AB (ref 30.0–36.0)
MCV: 100 fL (ref 78.0–100.0)
Platelets: 265 10*3/uL (ref 150–400)
RBC: 3.18 MIL/uL — AB (ref 4.22–5.81)
RDW: 16.2 % — ABNORMAL HIGH (ref 11.5–15.5)
WBC: 9.4 10*3/uL (ref 4.0–10.5)

## 2015-05-02 LAB — GLUCOSE, CAPILLARY
GLUCOSE-CAPILLARY: 163 mg/dL — AB (ref 65–99)
GLUCOSE-CAPILLARY: 136 mg/dL — AB (ref 65–99)
GLUCOSE-CAPILLARY: 121 mg/dL — AB (ref 65–99)
Glucose-Capillary: 143 mg/dL — ABNORMAL HIGH (ref 65–99)
Glucose-Capillary: 151 mg/dL — ABNORMAL HIGH (ref 65–99)
Glucose-Capillary: 152 mg/dL — ABNORMAL HIGH (ref 65–99)

## 2015-05-02 MED ORDER — ONDANSETRON HCL 4 MG/2ML IJ SOLN
4.0000 mg | Freq: Four times a day (QID) | INTRAMUSCULAR | Status: DC | PRN
  Administered 2015-05-02 – 2015-05-12 (×2): 4 mg via INTRAVENOUS
  Filled 2015-05-02: qty 2

## 2015-05-02 NOTE — Progress Notes (Signed)
Patient had 1 episode of emesis this afternoon at 1600. Dr. Janee Morn aware and orders given for Zofran PRN, also advised to hold tube feeds until morning. Will continue to monitor.
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
## 2015-05-02 NOTE — Progress Notes (Signed)
Patient's Cortrak tube found to be pulled out several inches. Small amount of tan thick secretions suctioned from endotracheal tube by RT. Large amount of tan drainage also coming out from subglottic.  Dr. Janee Morn made aware. Cortrak team paged and at bedside. Cortrak removed and new Cortrak placed by RD.

## 2015-05-03 LAB — GLUCOSE, CAPILLARY
GLUCOSE-CAPILLARY: 104 mg/dL — AB (ref 65–99)
GLUCOSE-CAPILLARY: 111 mg/dL — AB (ref 65–99)
GLUCOSE-CAPILLARY: 117 mg/dL — AB (ref 65–99)
GLUCOSE-CAPILLARY: 86 mg/dL (ref 65–99)
Glucose-Capillary: 119 mg/dL — ABNORMAL HIGH (ref 65–99)
Glucose-Capillary: 113 mg/dL — ABNORMAL HIGH (ref 65–99)
Glucose-Capillary: 79 mg/dL (ref 65–99)

## 2015-05-03 LAB — CBC
HEMATOCRIT: 32.3 % — AB (ref 39.0–52.0)
HEMOGLOBIN: 9.8 g/dL — AB (ref 13.0–17.0)
MCH: 29.1 pg (ref 26.0–34.0)
MCHC: 30.3 g/dL (ref 30.0–36.0)
MCV: 95.8 fL (ref 78.0–100.0)
Platelets: 287 10*3/uL (ref 150–400)
RBC: 3.37 MIL/uL — AB (ref 4.22–5.81)
RDW: 16.2 % — ABNORMAL HIGH (ref 11.5–15.5)
WBC: 11 10*3/uL — ABNORMAL HIGH (ref 4.0–10.5)

## 2015-05-03 LAB — BASIC METABOLIC PANEL
ANION GAP: 12 (ref 5–15)
BUN: 79 mg/dL — ABNORMAL HIGH (ref 6–20)
CHLORIDE: 113 mmol/L — AB (ref 101–111)
CO2: 22 mmol/L (ref 22–32)
CREATININE: 1.76 mg/dL — AB (ref 0.61–1.24)
Calcium: 9.4 mg/dL (ref 8.9–10.3)
GFR calc non Af Amer: 35 mL/min — ABNORMAL LOW (ref >60)
GFR, EST AFRICAN AMERICAN: 41 mL/min — AB (ref >60)
Glucose, Bld: 123 mg/dL — ABNORMAL HIGH (ref 65–99)
POTASSIUM: 4.4 mmol/L (ref 3.5–5.1)
Sodium: 147 mmol/L — ABNORMAL HIGH (ref 135–145)

## 2015-05-03 NOTE — Progress Notes (Signed)
Versed 20cc wasted in sink with second RN as witness
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
## 2015-05-04 ENCOUNTER — Inpatient Hospital Stay (HOSPITAL_COMMUNITY): Payer: No Typology Code available for payment source

## 2015-05-04 LAB — GLUCOSE, CAPILLARY
GLUCOSE-CAPILLARY: 115 mg/dL — AB (ref 65–99)
GLUCOSE-CAPILLARY: 119 mg/dL — AB (ref 65–99)
Glucose-Capillary: 107 mg/dL — ABNORMAL HIGH (ref 65–99)
Glucose-Capillary: 121 mg/dL — ABNORMAL HIGH (ref 65–99)
Glucose-Capillary: 108 mg/dL — ABNORMAL HIGH (ref 65–99)

## 2015-05-04 LAB — BASIC METABOLIC PANEL
ANION GAP: 15 (ref 5–15)
BUN: 79 mg/dL — ABNORMAL HIGH (ref 6–20)
CHLORIDE: 115 mmol/L — AB (ref 101–111)
CO2: 21 mmol/L — AB (ref 22–32)
Calcium: 9.5 mg/dL (ref 8.9–10.3)
Creatinine, Ser: 1.82 mg/dL — ABNORMAL HIGH (ref 0.61–1.24)
GFR calc non Af Amer: 34 mL/min — ABNORMAL LOW (ref >60)
GFR, EST AFRICAN AMERICAN: 39 mL/min — AB (ref >60)
Glucose, Bld: 116 mg/dL — ABNORMAL HIGH (ref 65–99)
POTASSIUM: 4.4 mmol/L (ref 3.5–5.1)
Sodium: 151 mmol/L — ABNORMAL HIGH (ref 135–145)

## 2015-05-04 LAB — CBC
HEMATOCRIT: 32.7 % — AB (ref 39.0–52.0)
HEMOGLOBIN: 9.9 g/dL — AB (ref 13.0–17.0)
MCH: 29.2 pg (ref 26.0–34.0)
MCHC: 30.3 g/dL (ref 30.0–36.0)
MCV: 96.5 fL (ref 78.0–100.0)
Platelets: 296 10*3/uL (ref 150–400)
RBC: 3.39 MIL/uL — ABNORMAL LOW (ref 4.22–5.81)
RDW: 15.9 % — ABNORMAL HIGH (ref 11.5–15.5)
WBC: 9.6 10*3/uL (ref 4.0–10.5)

## 2015-05-04 MED ORDER — FREE WATER
200.0000 mL | Freq: Four times a day (QID) | Status: DC
  Administered 2015-05-04 – 2015-05-05 (×4): 200 mL

## 2015-05-04 MED ORDER — PIVOT 1.5 CAL PO LIQD
1000.0000 mL | ORAL | Status: DC
  Administered 2015-05-04 – 2015-05-11 (×5): 1000 mL
  Filled 2015-05-04 (×16): qty 1000

## 2015-05-04 NOTE — Progress Notes (Signed)
Patient ID: [name], male   DOB: 1933/12/28, 82 y.o.   MRN: [id number] Doing well after extubation. Said his name when asked. Strong cough. Violeta Gelinas, MD, MPH, FACS Trauma: [phone] General Surgery: [phone]

## 2015-05-04 NOTE — Progress Notes (Signed)
Waste fentanyl 100cc witnessed by second [employer] fundraiser

## 2015-05-04 NOTE — Procedures (Signed)
Extubation Procedure Note  Patient Details:   Name: [name] DOB: 09-10-1933 MRN: [id number]   Airway Documentation:     Evaluation  O2 sats: stable throughout Complications: No apparent complications Patient did tolerate procedure well. Bilateral Breath Sounds: Clear Suctioning: Airway Yes   Positive cuff leak, on 2L [place], pt not responsive to verbal commands at this time, RT and RN will continue to monitor.  [place] R Torion Hulgan 05/04/2015, 9:51 AM

## 2015-05-04 NOTE — Progress Notes (Signed)
Pt tolerated extubation today.  CSW continue to follow for likely SNF placement at dc.  Pt currently on tube feedings; will need to be on a diet or have PEG tube, if discharging to SNF.  Will follow for swallowing evaluations.    Quintella Baton, RN, BSN  Trauma/Neuro ICU Case Manager [phone]
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
## 2015-05-05 LAB — GLUCOSE, CAPILLARY
GLUCOSE-CAPILLARY: 109 mg/dL — AB (ref 65–99)
GLUCOSE-CAPILLARY: 116 mg/dL — AB (ref 65–99)
Glucose-Capillary: 132 mg/dL — ABNORMAL HIGH (ref 65–99)
Glucose-Capillary: 132 mg/dL — ABNORMAL HIGH (ref 65–99)

## 2015-05-05 LAB — CBC
HCT: 35.5 % — ABNORMAL LOW (ref 39.0–52.0)
HEMOGLOBIN: 10.5 g/dL — AB (ref 13.0–17.0)
MCH: 28.3 pg (ref 26.0–34.0)
MCHC: 29.6 g/dL — ABNORMAL LOW (ref 30.0–36.0)
MCV: 95.7 fL (ref 78.0–100.0)
PLATELETS: 321 10*3/uL (ref 150–400)
RBC: 3.71 MIL/uL — AB (ref 4.22–5.81)
RDW: 15.6 % — ABNORMAL HIGH (ref 11.5–15.5)
WBC: 10.3 10*3/uL (ref 4.0–10.5)

## 2015-05-05 LAB — BASIC METABOLIC PANEL
ANION GAP: 13 (ref 5–15)
BUN: 65 mg/dL — ABNORMAL HIGH (ref 6–20)
CHLORIDE: 118 mmol/L — AB (ref 101–111)
CO2: 23 mmol/L (ref 22–32)
Calcium: 9.6 mg/dL (ref 8.9–10.3)
Creatinine, Ser: 1.55 mg/dL — ABNORMAL HIGH (ref 0.61–1.24)
GFR calc Af Amer: 47 mL/min — ABNORMAL LOW (ref >60)
GFR, EST NON AFRICAN AMERICAN: 41 mL/min — AB (ref >60)
Glucose, Bld: 131 mg/dL — ABNORMAL HIGH (ref 65–99)
POTASSIUM: 3.6 mmol/L (ref 3.5–5.1)
SODIUM: 154 mmol/L — AB (ref 135–145)

## 2015-05-05 MED ORDER — FREE WATER
300.0000 mL | Freq: Four times a day (QID) | Status: DC
  Administered 2015-05-05 – 2015-05-12 (×23): 300 mL
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
## 2015-05-05 NOTE — Clinical Social Work Note (Signed)
CSW spoke with [name] to inform them that the patient has been extubated and is improving. CSW provided facility with updated FL2 and clinicals. Facility is reviewing the patient's information to determine whether they will be able to accept him at discharge. CSW will continue to follow. CSW spoke with patient's son [name] who states that he and the patient's wife would like the patient evaluated for inpatient rehab. CSW explained to [name] that patient's have to meet certain criteria to admit to inpatient rehab and that SNF would be needed if the patient is not appropriate for that level of care. [name] verbalizes understanding of this but still wants the patient evaluated. RNCM and PA notified.    [name] MSW, [place], [place], [phone]

## 2015-05-05 NOTE — Care Management Important Message (Signed)
Important Message  Patient Details  Name: [name] MRN: [id number] Date of Birth: 1934/02/22   Medicare Important Message Given:  Yes    Jisella Ashenfelter P Deyani Hegarty 05/05/2015, 3:25 PM
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
## 2015-05-05 NOTE — Progress Notes (Signed)
Attempted to call report. Nurse is currently at bedside providing patient care will return call when available.

## 2015-05-05 NOTE — Progress Notes (Signed)
Thank you for re-consult on [name]. Please note original consult note from 01/05 with recommendations for SNF due to cognitive deficits, poor activity tolerance as well as lack of social supports.  Subsequent therapy notes as well as issues with PEA and recent extubation noted. Continue to recommend SNF for follow up therapy.

## 2015-05-06 LAB — GLUCOSE, CAPILLARY: Glucose-Capillary: 142 mg/dL — ABNORMAL HIGH (ref 65–99)

## 2015-05-06 LAB — BASIC METABOLIC PANEL
Anion gap: 15 (ref 5–15)
BUN: 55 mg/dL — AB (ref 6–20)
CHLORIDE: 118 mmol/L — AB (ref 101–111)
CO2: 22 mmol/L (ref 22–32)
CREATININE: 1.35 mg/dL — AB (ref 0.61–1.24)
Calcium: 9 mg/dL (ref 8.9–10.3)
GFR calc Af Amer: 56 mL/min — ABNORMAL LOW (ref >60)
GFR calc non Af Amer: 48 mL/min — ABNORMAL LOW (ref >60)
Glucose, Bld: 146 mg/dL — ABNORMAL HIGH (ref 65–99)
Potassium: 3.6 mmol/L (ref 3.5–5.1)
SODIUM: 155 mmol/L — AB (ref 135–145)

## 2015-05-06 LAB — MAGNESIUM: MAGNESIUM: 2.3 mg/dL (ref 1.7–2.4)

## 2015-05-06 LAB — PHOSPHORUS: Phosphorus: 2.9 mg/dL (ref 2.5–4.6)

## 2015-05-06 MED ORDER — POTASSIUM CL IN DEXTROSE 5% 20 MEQ/L IV SOLN
20.0000 meq | INTRAVENOUS | Status: DC
  Administered 2015-05-06 – 2015-05-09 (×3): 20 meq via INTRAVENOUS
  Filled 2015-05-06 (×7): qty 1000
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
## 2015-05-06 NOTE — Progress Notes (Signed)
Attempted report 
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
## 2015-05-06 NOTE — Progress Notes (Signed)
Received patient , a transfer from [facility]
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
## 2015-05-07 ENCOUNTER — Inpatient Hospital Stay (HOSPITAL_COMMUNITY): Payer: No Typology Code available for payment source

## 2015-05-07 LAB — GLUCOSE, CAPILLARY
GLUCOSE-CAPILLARY: 119 mg/dL — AB (ref 65–99)
GLUCOSE-CAPILLARY: 125 mg/dL — AB (ref 65–99)
GLUCOSE-CAPILLARY: 134 mg/dL — AB (ref 65–99)
GLUCOSE-CAPILLARY: 124 mg/dL — AB (ref 65–99)
GLUCOSE-CAPILLARY: 125 mg/dL — AB (ref 65–99)
GLUCOSE-CAPILLARY: 111 mg/dL — AB (ref 65–99)
GLUCOSE-CAPILLARY: 122 mg/dL — AB (ref 65–99)
Glucose-Capillary: 139 mg/dL — ABNORMAL HIGH (ref 65–99)
Glucose-Capillary: 103 mg/dL — ABNORMAL HIGH (ref 65–99)
Glucose-Capillary: 116 mg/dL — ABNORMAL HIGH (ref 65–99)
Glucose-Capillary: 128 mg/dL — ABNORMAL HIGH (ref 65–99)
Glucose-Capillary: 145 mg/dL — ABNORMAL HIGH (ref 65–99)
Glucose-Capillary: 116 mg/dL — ABNORMAL HIGH (ref 65–99)
Glucose-Capillary: 113 mg/dL — ABNORMAL HIGH (ref 65–99)

## 2015-05-07 LAB — BASIC METABOLIC PANEL
Anion gap: 7 (ref 5–15)
BUN: 49 mg/dL — AB (ref 6–20)
CO2: 24 mmol/L (ref 22–32)
Calcium: 9.1 mg/dL (ref 8.9–10.3)
Chloride: 120 mmol/L — ABNORMAL HIGH (ref 101–111)
Creatinine, Ser: 1.2 mg/dL (ref 0.61–1.24)
GFR calc Af Amer: >60 mL/min (ref >60)
GFR, EST NON AFRICAN AMERICAN: 56 mL/min — AB (ref >60)
GLUCOSE: 138 mg/dL — AB (ref 65–99)
POTASSIUM: 3.6 mmol/L (ref 3.5–5.1)
Sodium: 151 mmol/L — ABNORMAL HIGH (ref 135–145)

## 2015-05-07 NOTE — Progress Notes (Signed)
Patient tolerating TF of Pivot at 60 ml/ hr no residual noted  NGT in place patent.infusing Pivot via Pump.  Pt npo for peg placement on 05/08/15 concernt needed . No family around . Will consult / notify family in am. Pt remain confused and attempted x 1 OOB. Aspern cervical collar and lumbar coset in use.Pt tachypnea but no acute distress all night.

## 2015-05-07 NOTE — Clinical Social Work Note (Signed)
Clinical Social Worker continuing to follow patient and family for support and discharge planning needs.  Patient assessed by admissions coordinator and RN from [place] today at bedside - expressed concerns about patient breathing and the presence of restraints (mittens).  Patient must be restraint free for 24 hours prior to admission - PA/MD aware.  CSW to follow up with patient family following PEG tube placement and patient medical stability for discharge.  CSW remains available for support and to facilitate patient discharge needs once medically ready.  Macario Golds, [place] [phone]
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
## 2015-05-08 ENCOUNTER — Inpatient Hospital Stay (HOSPITAL_COMMUNITY): Payer: No Typology Code available for payment source | Admitting: Certified Registered Nurse Anesthetist

## 2015-05-08 ENCOUNTER — Encounter (HOSPITAL_COMMUNITY)
Admission: EM | Disposition: A | Discharge status: Nursing Home (Includes ICF) | Payer: Self-pay | Source: Home / Self Care

## 2015-05-08 ENCOUNTER — Encounter (HOSPITAL_COMMUNITY): Payer: Self-pay | Admitting: *Deleted

## 2015-05-08 HISTORY — PX: PEG PLACEMENT: SHX5437

## 2015-05-08 HISTORY — PX: ESOPHAGOGASTRODUODENOSCOPY (EGD) WITH PROPOFOL: SHX5813

## 2015-05-08 LAB — GLUCOSE, CAPILLARY
GLUCOSE-CAPILLARY: 119 mg/dL — AB (ref 65–99)
GLUCOSE-CAPILLARY: 112 mg/dL — AB (ref 65–99)
GLUCOSE-CAPILLARY: 140 mg/dL — AB (ref 65–99)
Glucose-Capillary: 102 mg/dL — ABNORMAL HIGH (ref 65–99)
Glucose-Capillary: 103 mg/dL — ABNORMAL HIGH (ref 65–99)
Glucose-Capillary: 111 mg/dL — ABNORMAL HIGH (ref 65–99)

## 2015-05-08 LAB — BASIC METABOLIC PANEL
Anion gap: 7 (ref 5–15)
Anion gap: 8 (ref 5–15)
BUN: 40 mg/dL — AB (ref 6–20)
BUN: 36 mg/dL — AB (ref 6–20)
CALCIUM: 9.2 mg/dL (ref 8.9–10.3)
CO2: 23 mmol/L (ref 22–32)
CO2: 22 mmol/L (ref 22–32)
Calcium: 9.4 mg/dL (ref 8.9–10.3)
Chloride: 118 mmol/L — ABNORMAL HIGH (ref 101–111)
Chloride: 118 mmol/L — ABNORMAL HIGH (ref 101–111)
Creatinine, Ser: 1.21 mg/dL (ref 0.61–1.24)
Creatinine, Ser: 1.15 mg/dL (ref 0.61–1.24)
GFR calc Af Amer: >60 mL/min (ref >60)
GFR calc Af Amer: >60 mL/min (ref >60)
GFR, EST NON AFRICAN AMERICAN: 55 mL/min — AB (ref >60)
GFR, EST NON AFRICAN AMERICAN: 59 mL/min — AB (ref >60)
GLUCOSE: 107 mg/dL — AB (ref 65–99)
Glucose, Bld: 127 mg/dL — ABNORMAL HIGH (ref 65–99)
POTASSIUM: 4.6 mmol/L (ref 3.5–5.1)
POTASSIUM: 4.3 mmol/L (ref 3.5–5.1)
SODIUM: 148 mmol/L — AB (ref 135–145)
Sodium: 148 mmol/L — ABNORMAL HIGH (ref 135–145)

## 2015-05-08 SURGERY — INSERTION, PEG TUBE
Anesthesia: Monitor Anesthesia Care

## 2015-05-08 MED ORDER — PROPOFOL 10 MG/ML IV BOLUS
INTRAVENOUS | Status: DC | PRN
  Administered 2015-05-08 (×2): 20 mg via INTRAVENOUS
  Administered 2015-05-08 (×4): 10 mg via INTRAVENOUS
  Administered 2015-05-08: 20 mg via INTRAVENOUS

## 2015-05-08 MED ORDER — LACTATED RINGERS IV SOLN
INTRAVENOUS | Status: DC | PRN
  Administered 2015-05-08: 13:00:00 via INTRAVENOUS

## 2015-05-08 MED ORDER — LORAZEPAM 2 MG/ML IJ SOLN
1.0000 mg | INTRAMUSCULAR | Status: DC | PRN
  Administered 2015-05-09 – 2015-05-17 (×6): 1 mg via INTRAVENOUS
  Filled 2015-05-08 (×7): qty 1

## 2015-05-08 MED ORDER — LORAZEPAM 2 MG/ML IJ SOLN: 1.0000 mg | INTRAMUSCULAR | Status: DC

## 2015-05-08 MED ORDER — PHENYLEPHRINE HCL 10 MG/ML IJ SOLN
INTRAMUSCULAR | Status: DC | PRN
  Administered 2015-05-08: 120 ug via INTRAVENOUS

## 2015-05-08 MED ORDER — KETAMINE HCL 100 MG/ML IJ SOLN
INTRAMUSCULAR | Status: AC
  Filled 2015-05-08: qty 1

## 2015-05-08 MED ORDER — KETAMINE HCL 100 MG/ML IJ SOLN
INTRAMUSCULAR | Status: DC | PRN
  Administered 2015-05-08 (×4): 5 mg via INTRAVENOUS

## 2015-05-08 MED ORDER — LACTATED RINGERS IV SOLN
INTRAVENOUS | Status: DC
  Administered 2015-05-08: 1000 mL via INTRAVENOUS
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
## 2015-05-08 NOTE — Care Management Important Message (Signed)
Important Message  Patient Details  Name: [name] MRN: [id number] Date of Birth: [birth year]   Medicare Important Message Given:  Yes    Kit Brubacher P Melitza Metheny 05/08/2015, 4:06 PM

## 2015-05-08 NOTE — Transfer of Care (Signed)
Immediate Anesthesia Transfer of Care Note  Patient: [name]  Procedure(s) Performed: Procedure(s): PERCUTANEOUS ENDOSCOPIC GASTROSTOMY (PEG) PLACEMENT (N/A)  Patient Location: Endoscopy Unit  Anesthesia Type:MAC  Level of Consciousness: awake  Airway & Oxygen Therapy: Patient Spontanous Breathing and Patient connected to nasal cannula oxygen  Post-op Assessment: Report given to RN, Post -op Vital signs reviewed and stable and Patient moving all extremities X 4  Post vital signs: Reviewed and stable  Last Vitals:  Filed Vitals:   05/08/15 1026 05/08/15 1223  BP: 118/78 119/77  Pulse: 98 98  Temp: 36.7 C 37.2 C  Resp: 20 27    Complications: No apparent anesthesia complications

## 2015-05-08 NOTE — Anesthesia Postprocedure Evaluation (Signed)
Anesthesia Post Note  Patient: Odean Fester  Procedure(s) Performed: Procedure(s) (LRB): PERCUTANEOUS ENDOSCOPIC GASTROSTOMY (PEG) PLACEMENT (N/A) ESOPHAGOGASTRODUODENOSCOPY (EGD) WITH PROPOFOL  Patient location during evaluation: PACU Anesthesia Type: MAC Level of consciousness: confused Pain management: pain level controlled Vital Signs Assessment: post-procedure vital signs reviewed and stable Respiratory status: spontaneous breathing Cardiovascular status: stable Anesthetic complications: no    Last Vitals:  Filed Vitals:   05/08/15 1400 05/08/15 1410  BP: 109/67 115/73  Pulse: 101 96  Temp:    Resp: 20 17    Last Pain:  Filed Vitals:   05/08/15 1413  PainSc: 0-No pain                 Lewie Loron
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
## 2015-05-08 NOTE — Progress Notes (Signed)
Orthopedic Tech Progress Note Patient Details:  [name] Jan 07, 1934 [id number]  Ortho Devices Type of Ortho Device: Abdominal binder Ortho Device/Splint Location: abdomen Ortho Device/Splint Interventions: Freeman Caldron, Lauralye Kinn 05/08/2015, 4:01 PM

## 2015-05-08 NOTE — Op Note (Signed)
OPERATIVE REPORT  DATE OF OPERATION:  05/08/2015  PATIENT:  [name]  82 y.o. male  PRE-OPERATIVE DIAGNOSIS:  dysphagia  POST-OPERATIVE DIAGNOSIS:  Same  PROCEDURE:  Procedure(s): PERCUTANEOUS ENDOSCOPIC GASTROSTOMY (PEG) PLACEMENT  SURGEON:  Surgeon(s): Jimmye [place], MD  ASSISTANT: [place], NP-C  ANESTHESIA:   local, IV sedation and MAC  EBL: <10 ml  BLOOD ADMINISTERED: none  DRAINS: Gastrostomy Tube   SPECIMEN:  No Specimen  COUNTS CORRECT:  YES  PROCEDURE DETAILS: The procedure was performed in the endoscopy suite under local anesthetic and monitored anesthesia care.  Once the patient was given adequate amount of intravenous sedation and a partial anesthetic by the CRNA in the room, he was prepped and draped in usual sterile manner.  A proper timeout was performed identifying the patient and the procedure to be performed. A Pentax 2990 scope was passed oral pharyngeal he into the patient's upper esophagus then down into the body of the stomach. We passed it through the pylorus into the first portion of duodenum were bile stained mucosa was noted. There was no other evidence of pathology.  The endoscope was retrieved back into the body of the stomach where the impulse from the assistant's finger on the intra-abdominal wall was noted on the anterior wall of the stomach. Was at that site that incision was made after local anesthetic was placed. An angiocatheter was passed directly into the stomach under direct vision through which a looped blue wire was passed. Once this was done the wire was pulled back to the patient's mouth using the endoscope and subsequently attached to the pull-through gastrostomy tube to its resting position on the anterior gastric wall.  Subsequent pictures demonstrated the gastrostomy tube in adequate position. All counts were correct including needles, sponges, and instruments.  PATIENT DISPOSITION:  PACU - hemodynamically stable.       [name] 1/27/20171:44 PM

## 2015-05-09 LAB — BASIC METABOLIC PANEL
Anion gap: 11 (ref 5–15)
BUN: 39 mg/dL — AB (ref 6–20)
CALCIUM: 9.2 mg/dL (ref 8.9–10.3)
CO2: 21 mmol/L — AB (ref 22–32)
Chloride: 112 mmol/L — ABNORMAL HIGH (ref 101–111)
Creatinine, Ser: 1.17 mg/dL (ref 0.61–1.24)
GFR calc Af Amer: >60 mL/min (ref >60)
GFR, EST NON AFRICAN AMERICAN: 57 mL/min — AB (ref >60)
GLUCOSE: 138 mg/dL — AB (ref 65–99)
POTASSIUM: 4.4 mmol/L (ref 3.5–5.1)
Sodium: 144 mmol/L (ref 135–145)

## 2015-05-09 LAB — GLUCOSE, CAPILLARY
GLUCOSE-CAPILLARY: 141 mg/dL — AB (ref 65–99)
GLUCOSE-CAPILLARY: 131 mg/dL — AB (ref 65–99)
GLUCOSE-CAPILLARY: 115 mg/dL — AB (ref 65–99)
Glucose-Capillary: 119 mg/dL — ABNORMAL HIGH (ref 65–99)
Glucose-Capillary: 123 mg/dL — ABNORMAL HIGH (ref 65–99)
Glucose-Capillary: 151 mg/dL — ABNORMAL HIGH (ref 65–99)

## 2015-05-09 LAB — MAGNESIUM: Magnesium: 1.8 mg/dL (ref 1.7–2.4)

## 2015-05-09 MED ORDER — CHLORHEXIDINE GLUCONATE 0.12 % MT SOLN
OROMUCOSAL | Status: AC
  Administered 2015-05-09: 1 mL
  Filled 2015-05-09: qty 15
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
## 2015-05-10 LAB — GLUCOSE, CAPILLARY
GLUCOSE-CAPILLARY: 116 mg/dL — AB (ref 65–99)
GLUCOSE-CAPILLARY: 119 mg/dL — AB (ref 65–99)
GLUCOSE-CAPILLARY: 126 mg/dL — AB (ref 65–99)
GLUCOSE-CAPILLARY: 138 mg/dL — AB (ref 65–99)
GLUCOSE-CAPILLARY: 141 mg/dL — AB (ref 65–99)
Glucose-Capillary: 107 mg/dL — ABNORMAL HIGH (ref 65–99)
Glucose-Capillary: 113 mg/dL — ABNORMAL HIGH (ref 65–99)

## 2015-05-10 LAB — BASIC METABOLIC PANEL
Anion gap: 3 — ABNORMAL LOW (ref 5–15)
BUN: 37 mg/dL — AB (ref 6–20)
CALCIUM: 8.6 mg/dL — AB (ref 8.9–10.3)
CO2: 22 mmol/L (ref 22–32)
CREATININE: 1.07 mg/dL (ref 0.61–1.24)
Chloride: 116 mmol/L — ABNORMAL HIGH (ref 101–111)
GFR calc non Af Amer: >60 mL/min (ref >60)
Glucose, Bld: 149 mg/dL — ABNORMAL HIGH (ref 65–99)
Potassium: 3.9 mmol/L (ref 3.5–5.1)
SODIUM: 141 mmol/L (ref 135–145)

## 2015-05-10 MED ORDER — SODIUM CHLORIDE 0.9% FLUSH
10.0000 mL | INTRAVENOUS | Status: DC | PRN
  Administered 2015-05-10: 30 mL
  Administered 2015-05-11: 10 mL
  Filled 2015-05-10: qty 40
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
## 2015-05-11 ENCOUNTER — Encounter (HOSPITAL_COMMUNITY): Payer: Self-pay | Admitting: General Surgery

## 2015-05-11 LAB — GLUCOSE, CAPILLARY
GLUCOSE-CAPILLARY: 123 mg/dL — AB (ref 65–99)
Glucose-Capillary: 138 mg/dL — ABNORMAL HIGH (ref 65–99)
Glucose-Capillary: 131 mg/dL — ABNORMAL HIGH (ref 65–99)
Glucose-Capillary: 131 mg/dL — ABNORMAL HIGH (ref 65–99)
Glucose-Capillary: 125 mg/dL — ABNORMAL HIGH (ref 65–99)
Glucose-Capillary: 105 mg/dL — ABNORMAL HIGH (ref 65–99)
Glucose-Capillary: 133 mg/dL — ABNORMAL HIGH (ref 65–99)

## 2015-05-11 NOTE — Care Management Important Message (Signed)
Important Message  Patient Details  Name: [name] MRN: [id number] Date of Birth: April 09, 1934   Medicare Important Message Given:  Yes    Lateefah Mallery P Levone Otten 05/11/2015, 2:53 PM

## 2015-05-11 NOTE — Clinical Social Work Note (Signed)
Clinical Social Worker continuing to follow patient and family for support and discharge planning needs.  CSW contacted [facility] regarding patient status for potential discharge needs.  Facility plans to review patient chart and provide to DON but continue to remain on board with patient admission when medically stable.  Patient continues to have mittens which at this time are a considered a restraint - patient must go 24 hours with no restraints prior to SNF discharge.  CSW left a message with patient son regarding patient discharge plans - await return call to provide full update.  CSW remains available for support and to facilitate patient discharge needs once medically stable.  Macario Golds, [place] [phone]
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
## 2015-05-12 ENCOUNTER — Inpatient Hospital Stay (HOSPITAL_COMMUNITY): Payer: No Typology Code available for payment source

## 2015-05-12 DIAGNOSIS — J9601 Acute respiratory failure with hypoxia: Secondary | ICD-10-CM

## 2015-05-12 DIAGNOSIS — Z66 Do not resuscitate: Secondary | ICD-10-CM

## 2015-05-12 LAB — BLOOD GAS, ARTERIAL
ACID-BASE DEFICIT: 7.6 mmol/L — AB (ref 0.0–2.0)
ACID-BASE DEFICIT: 6.7 mmol/L — AB (ref 0.0–2.0)
BICARBONATE: 18.9 meq/L — AB (ref 20.0–24.0)
Bicarbonate: 19.2 mEq/L — ABNORMAL LOW (ref 20.0–24.0)
DRAWN BY: 252031
FIO2: 1
FIO2: 0.8
LHR: 15 {breaths}/min
MECHVT: 590 mL
MECHVT: 590 mL
O2 SAT: 81.1 %
O2 Saturation: 83.5 %
PEEP/CPAP: 10 cmH2O
PEEP/CPAP: 5 cmH2O
PH ART: 7.255 — AB (ref 7.350–7.450)
PO2 ART: 61.5 mmHg — AB (ref 80.0–100.0)
Patient temperature: 98.6
Patient temperature: 98.6
RATE: 15 resp/min
TCO2: 20.4 mmol/L (ref 0–100)
TCO2: 20.5 mmol/L (ref 0–100)
pCO2 arterial: 48.9 mmHg — ABNORMAL HIGH (ref 35.0–45.0)
pCO2 arterial: 44.7 mmHg (ref 35.0–45.0)
pH, Arterial: 7.212 — ABNORMAL LOW (ref 7.350–7.450)
pO2, Arterial: 62 mmHg — ABNORMAL LOW (ref 80.0–100.0)

## 2015-05-12 LAB — BASIC METABOLIC PANEL
ANION GAP: 16 — AB (ref 5–15)
BUN: 42 mg/dL — ABNORMAL HIGH (ref 6–20)
CALCIUM: 9.4 mg/dL (ref 8.9–10.3)
CO2: 20 mmol/L — AB (ref 22–32)
Chloride: 109 mmol/L (ref 101–111)
Creatinine, Ser: 1.8 mg/dL — ABNORMAL HIGH (ref 0.61–1.24)
GFR calc non Af Amer: 34 mL/min — ABNORMAL LOW (ref >60)
GFR, EST AFRICAN AMERICAN: 40 mL/min — AB (ref >60)
Glucose, Bld: 160 mg/dL — ABNORMAL HIGH (ref 65–99)
Potassium: 4.4 mmol/L (ref 3.5–5.1)
Sodium: 145 mmol/L (ref 135–145)

## 2015-05-12 LAB — TROPONIN I: Troponin I: 0.05 ng/mL — ABNORMAL HIGH (ref <0.031)

## 2015-05-12 LAB — GLUCOSE, CAPILLARY
GLUCOSE-CAPILLARY: 149 mg/dL — AB (ref 65–99)
Glucose-Capillary: 123 mg/dL — ABNORMAL HIGH (ref 65–99)
Glucose-Capillary: 136 mg/dL — ABNORMAL HIGH (ref 65–99)
Glucose-Capillary: 112 mg/dL — ABNORMAL HIGH (ref 65–99)
Glucose-Capillary: 126 mg/dL — ABNORMAL HIGH (ref 65–99)

## 2015-05-12 LAB — CBC
HEMATOCRIT: 39.2 % (ref 39.0–52.0)
HEMOGLOBIN: 11.7 g/dL — AB (ref 13.0–17.0)
MCH: 28.7 pg (ref 26.0–34.0)
MCHC: 29.8 g/dL — ABNORMAL LOW (ref 30.0–36.0)
MCV: 96.3 fL (ref 78.0–100.0)
Platelets: 328 10*3/uL (ref 150–400)
RBC: 4.07 MIL/uL — AB (ref 4.22–5.81)
RDW: 15.5 % (ref 11.5–15.5)
WBC: 3.8 10*3/uL — AB (ref 4.0–10.5)

## 2015-05-12 LAB — PHOSPHORUS: PHOSPHORUS: 4.2 mg/dL (ref 2.5–4.6)

## 2015-05-12 LAB — MAGNESIUM: MAGNESIUM: 2.3 mg/dL (ref 1.7–2.4)

## 2015-05-12 MED ORDER — SODIUM CHLORIDE 0.9 % IV SOLN
25.0000 ug/h | INTRAVENOUS | Status: DC
  Administered 2015-05-12: 50 ug/h via INTRAVENOUS
  Administered 2015-05-14: 75 ug/h via INTRAVENOUS
  Administered 2015-05-15: 50 ug/h via INTRAVENOUS
  Administered 2015-05-15 – 2015-05-17 (×2): 100 ug/h via INTRAVENOUS
  Administered 2015-05-17 – 2015-05-18 (×2): 125 ug/h via INTRAVENOUS
  Administered 2015-05-19: 100 ug/h via INTRAVENOUS
  Administered 2015-05-20: 75 ug/h via INTRAVENOUS
  Filled 2015-05-12 (×9): qty 50

## 2015-05-12 MED ORDER — MIDAZOLAM BOLUS VIA INFUSION
1.0000 mg | INTRAVENOUS | Status: DC | PRN
  Filled 2015-05-12: qty 2

## 2015-05-12 MED ORDER — VANCOMYCIN HCL 10 G IV SOLR
1250.0000 mg | INTRAVENOUS | Status: DC
  Administered 2015-05-13 – 2015-05-14 (×3): 1250 mg via INTRAVENOUS
  Filled 2015-05-12 (×3): qty 1250

## 2015-05-12 MED ORDER — MIDAZOLAM HCL 2 MG/2ML IJ SOLN
2.0000 mg | INTRAMUSCULAR | Status: DC | PRN
  Filled 2015-05-12: qty 2

## 2015-05-12 MED ORDER — MIDAZOLAM HCL 2 MG/2ML IJ SOLN: 2.0000 mg | INTRAMUSCULAR | Status: DC | PRN

## 2015-05-12 MED ORDER — SENNOSIDES 8.8 MG/5ML PO SYRP
5.0000 mL | ORAL_SOLUTION | Freq: Two times a day (BID) | ORAL | Status: DC | PRN
  Filled 2015-05-12: qty 5

## 2015-05-12 MED ORDER — SODIUM BICARBONATE 8.4 % IV SOLN: 50.0000 meq | Freq: Once | INTRAVENOUS | Status: AC

## 2015-05-12 MED ORDER — DEXTROSE 5 % IV SOLN
INTRAVENOUS | Status: DC
  Administered 2015-05-12: 23:00:00 via INTRAVENOUS

## 2015-05-12 MED ORDER — PROPOFOL 1000 MG/100ML IV EMUL
INTRAVENOUS | Status: AC
Start: 2015-05-12 — End: 2015-05-12
  Administered 2015-05-12: 1000 mg
  Filled 2015-05-12: qty 100

## 2015-05-12 MED ORDER — SODIUM CHLORIDE 0.9 % IV SOLN
INTRAVENOUS | Status: DC
  Administered 2015-05-13 – 2015-05-19 (×14): via INTRAVENOUS

## 2015-05-12 MED ORDER — DEXTROSE 5 % IV SOLN
2.0000 g | Freq: Two times a day (BID) | INTRAVENOUS | Status: DC
  Filled 2015-05-12: qty 2

## 2015-05-12 MED ORDER — SODIUM CHLORIDE 0.9 % IV BOLUS (SEPSIS)
2000.0000 mL | Freq: Once | INTRAVENOUS | Status: AC
  Administered 2015-05-13: 2000 mL via INTRAVENOUS

## 2015-05-12 MED ORDER — ONDANSETRON HCL 4 MG/2ML IJ SOLN
INTRAMUSCULAR | Status: AC
  Filled 2015-05-12: qty 2

## 2015-05-12 MED ORDER — CLONAZEPAM 0.1 MG/ML ORAL SUSPENSION
0.5000 mg | Freq: Two times a day (BID) | ORAL | Status: DC
  Filled 2015-05-12 (×3): qty 5

## 2015-05-12 MED ORDER — FENTANYL CITRATE (PF) 100 MCG/2ML IJ SOLN: 50.0000 ug | Freq: Once | INTRAMUSCULAR | Status: AC

## 2015-05-12 MED ORDER — SODIUM CHLORIDE 0.9 % IV BOLUS (SEPSIS)
1000.0000 mL | Freq: Once | INTRAVENOUS | Status: AC
  Administered 2015-05-12: 1000 mL via INTRAVENOUS

## 2015-05-12 MED ORDER — DEXTROSE 5 % IV SOLN
1.0000 g | Freq: Two times a day (BID) | INTRAVENOUS | Status: DC
  Administered 2015-05-13 – 2015-05-14 (×5): 1 g via INTRAVENOUS
  Filled 2015-05-12 (×6): qty 1

## 2015-05-12 MED ORDER — DEXTROSE 5 % IV SOLN
0.0000 ug/min | INTRAVENOUS | Status: DC
  Administered 2015-05-12: 30 ug/min via INTRAVENOUS
  Filled 2015-05-12 (×2): qty 4

## 2015-05-12 MED ORDER — SODIUM BICARBONATE 8.4 % IV SOLN
INTRAVENOUS | Status: AC
  Filled 2015-05-12: qty 50

## 2015-05-12 MED ORDER — FENTANYL BOLUS VIA INFUSION
50.0000 ug | INTRAVENOUS | Status: DC | PRN
  Administered 2015-05-12 – 2015-05-17 (×2): 50 ug via INTRAVENOUS
  Filled 2015-05-12: qty 50

## 2015-05-12 MED ORDER — SODIUM BICARBONATE 8.4 % IV SOLN
50.0000 meq | Freq: Once | INTRAVENOUS | Status: AC
  Administered 2015-05-12: 50 meq via INTRAVENOUS

## 2015-05-12 MED ORDER — DOPAMINE-DEXTROSE 3.2-5 MG/ML-% IV SOLN
0.0000 ug/kg/min | INTRAVENOUS | Status: DC
  Administered 2015-05-12: 5 ug/kg/min via INTRAVENOUS
  Administered 2015-05-13: 15 ug/kg/min via INTRAVENOUS
  Administered 2015-05-13: 20 ug/kg/min via INTRAVENOUS
  Filled 2015-05-12 (×2): qty 250

## 2015-05-12 MED ORDER — FREE WATER
200.0000 mL | Freq: Four times a day (QID) | Status: DC
  Administered 2015-05-12 – 2015-05-20 (×27): 200 mL

## 2015-05-12 MED ORDER — CLONAZEPAM 0.5 MG PO TABS
0.5000 mg | ORAL_TABLET | Freq: Two times a day (BID) | ORAL | Status: DC
  Administered 2015-05-12 – 2015-05-20 (×16): 0.5 mg
  Filled 2015-05-12 (×16): qty 1

## 2015-05-12 MED ORDER — ROCURONIUM BROMIDE 50 MG/5ML IV SOLN
1.0000 mg/kg | Freq: Once | INTRAVENOUS | Status: AC
Start: 2015-05-12 — End: 2015-05-13
  Administered 2015-05-13: 80.7 mg via INTRAVENOUS
  Filled 2015-05-12 (×3): qty 8.07

## 2015-05-12 MED ORDER — MIDAZOLAM HCL 5 MG/ML IJ SOLN
0.0000 mg/h | INTRAMUSCULAR | Status: DC
  Administered 2015-05-12: 2 mg/h via INTRAVENOUS
  Administered 2015-05-13 – 2015-05-14 (×2): 3 mg/h via INTRAVENOUS
  Filled 2015-05-12 (×4): qty 10

## 2015-05-12 MED ORDER — HYDROCORTISONE NA SUCCINATE PF 100 MG IJ SOLR
50.0000 mg | Freq: Four times a day (QID) | INTRAMUSCULAR | Status: DC
  Administered 2015-05-13 – 2015-05-17 (×18): 50 mg via INTRAVENOUS
  Filled 2015-05-12 (×18): qty 2

## 2015-05-12 MED ORDER — JEVITY 1.2 CAL PO LIQD
1000.0000 mL | ORAL | Status: DC
Start: 2015-05-12 — End: 2015-05-15
  Administered 2015-05-12: 1000 mL
  Filled 2015-05-12 (×8): qty 1000
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
## 2015-05-12 NOTE — Procedures (Signed)
Intubation Procedure Note [name] [id number] Sep 12, 1933  Procedure: Intubation Indications: Respiratory insufficiency  Procedure Details Consent: Risks of procedure as well as the alternatives and risks of each were explained to the (patient/caregiver).  Consent for procedure obtained. and Unable to obtain consent because of altered level of consciousness. Time Out: Verified patient identification, verified procedure, site/side was marked, verified correct patient position, special equipment/implants available, medications/allergies/relevent history reviewed, required imaging and test results available.  Performed  Maximum sterile technique was used including cap, gloves, gown, hand hygiene and mask.  MAC    Evaluation Hemodynamic Status: Transient hypotension treated with pressors and treated with fluid; O2 sats: currently acceptable Patient's Current Condition: stable Complications: No apparent complications Patient did tolerate procedure well. Chest X-ray ordered to verify placement.  CXR: tube position acceptable.   Leafy Half 05/12/2015

## 2015-05-12 NOTE — Procedures (Signed)
Arterial Catheter Insertion Procedure Note Giovonni Poirier [id number] 23-Jun-1933  Procedure: Insertion of Arterial Catheter  Indications: Blood pressure monitoring and Frequent blood sampling  Procedure Details Consent: Risks of procedure as well as the alternatives and risks of each were explained to the (patient/caregiver).  Consent for procedure obtained. and Unable to obtain consent because of emergent medical necessity. Time Out: Verified patient identification, verified procedure, site/side was marked, verified correct patient position, special equipment/implants available, medications/allergies/relevent history reviewed, required imaging and test results available.  Performed  Maximum sterile technique was used including antiseptics, cap, gloves, gown, hand hygiene, mask and sheet. Skin prep: Chlorhexidine; local anesthetic administered 22 gauge catheter was inserted into right radial artery using the Seldinger technique.  Evaluation Blood flow good; BP tracing good. Complications: No apparent complications.   Leafy Half 05/12/2015

## 2015-05-12 NOTE — Progress Notes (Signed)
Was notified that patient was having some tachycardia.  EKG ordered and showed sinus tachy. Pt with h/o agitation.  Nurse suctioning orally.  CXR ordered. TF ongoing.  Prior to CXR, paged by nurse that code called secondary to bradycardia in the 50's.   At the bedside pt appeared tachypneic.  I made to decision to have the patient intubated.  TFs off.  On intubation, CRNA noted TF and pt likely aspirated.  pCXR revealed ETT in correct position, but otherwise no other acute findings.  Pt's granddaughter was notified of current events via phone. Pt transferred to ICU.  Labs pending.

## 2015-05-12 NOTE — Progress Notes (Signed)
Pt observed to be very restless at shift changed with HR in the 140s, Resp noisy and stetorous at a rate of 36, congested cough,  had a BM, NT trying to clean pt up, day shift RN earlier paged the on call trauma doc (Dr Derrell Lolling) who ordered an EKG, same done and reported back to him, ordered to give 1 mg of ativan iv and to get a CXR, pt put on oxygen at [facility] and the rapid respond RN paged at 1940, vomited when trying to be suctioned,  oxygen saturation observed to be 65% and the HR dropped to 57, a code blue was called at 1950, pt still had a pulse but with agonal respirations, Dr Derrell Lolling paged again and notified, code team came and pt was intubated at 2000, pt's grand-daughter called on the phone, spoke with Dr Derrell Lolling about the current situation, pt transferred to 3M05 at 2020, report given to [facility].Magda Bernheim, Tiger Spieker Efe
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
## 2015-05-12 NOTE — Progress Notes (Signed)
Feeding tube and water bag changed, IV team consult ordered. Patients son visited updated him on his status, will continue to monitor.

## 2015-05-12 NOTE — Progress Notes (Signed)
Consulted OT, at medical director's instruction, to see if any modifications can be made with hard collar.  Per report, when pt has mitts off, he immediately tries to remove collar, which has velcro fasteners.  Are there any modifications that can be made to prevent pt removing? Please advise.    Thanks,   Quintella Baton, RN, BSN  Trauma/Neuro ICU Case Manager [phone]

## 2015-05-12 NOTE — Progress Notes (Signed)
OT Cancellation Note  Patient Details Name: [name] MRN: [id number] DOB: 02-Jun-1933   Cancelled Treatment:    Reason Eval/Treat Not Completed: Other (comment) Attempted to see per case management request to assess regarding pt removing braces, etc due to apparent agitation. Pt given Ativan and too lethargic to assess. Will attempt again tomorrow.  [facility] Lylla Eifler, OTR/L  [phone] 05/12/2015 05/12/2015, 12:55 PM

## 2015-05-12 NOTE — Clinical Social Work Note (Signed)
Clinical Social Worker continuing to follow patient and family for support and discharge planning.  CSW received return call from patient son stating his continued agreement for patient placement at SNF upon discharge.  Patient son mentioned the possibility of [facility], however remains on board with placement at [facility].  CSW left message with facility.  CSW awaiting removal of mittens for 24 hours in order to further pursue SNF discharge.  [name], [place] [phone]

## 2015-05-13 ENCOUNTER — Inpatient Hospital Stay (HOSPITAL_COMMUNITY): Payer: No Typology Code available for payment source

## 2015-05-13 DIAGNOSIS — R609 Edema, unspecified: Secondary | ICD-10-CM

## 2015-05-13 DIAGNOSIS — J9601 Acute respiratory failure with hypoxia: Secondary | ICD-10-CM

## 2015-05-13 DIAGNOSIS — N179 Acute kidney failure, unspecified: Secondary | ICD-10-CM

## 2015-05-13 DIAGNOSIS — Z66 Do not resuscitate: Secondary | ICD-10-CM

## 2015-05-13 DIAGNOSIS — R579 Shock, unspecified: Secondary | ICD-10-CM

## 2015-05-13 LAB — CBC WITH DIFFERENTIAL/PLATELET
BASOS ABS: 0 10*3/uL (ref 0.0–0.1)
BLASTS: 0 %
Band Neutrophils: 26 %
Basophils Relative: 0 %
Eosinophils Absolute: 0 10*3/uL (ref 0.0–0.7)
Eosinophils Relative: 0 %
HCT: 37.3 % — ABNORMAL LOW (ref 39.0–52.0)
HEMOGLOBIN: 11.5 g/dL — AB (ref 13.0–17.0)
LYMPHS PCT: 10 %
Lymphs Abs: 0.9 10*3/uL (ref 0.7–4.0)
MCH: 29.2 pg (ref 26.0–34.0)
MCHC: 30.8 g/dL (ref 30.0–36.0)
MCV: 94.7 fL (ref 78.0–100.0)
MONOS PCT: 8 %
Metamyelocytes Relative: 12 %
Monocytes Absolute: 0.7 10*3/uL (ref 0.1–1.0)
Myelocytes: 28 %
NEUTROS ABS: 7.3 10*3/uL (ref 1.7–7.7)
Neutrophils Relative %: 16 %
OTHER: 0 %
PLATELETS: 281 10*3/uL (ref 150–400)
PROMYELOCYTES ABS: 0 %
RBC: 3.94 MIL/uL — AB (ref 4.22–5.81)
RDW: 15.5 % (ref 11.5–15.5)
WBC: 8.9 10*3/uL (ref 4.0–10.5)
nRBC: 0 /100 WBC

## 2015-05-13 LAB — HEPATIC FUNCTION PANEL
ALBUMIN: 1.7 g/dL — AB (ref 3.5–5.0)
ALBUMIN: 2.1 g/dL — AB (ref 3.5–5.0)
ALT: 23 U/L (ref 17–63)
ALT: 32 U/L (ref 17–63)
AST: 24 U/L (ref 15–41)
AST: 39 U/L (ref 15–41)
Alkaline Phosphatase: 74 U/L (ref 38–126)
Alkaline Phosphatase: 92 U/L (ref 38–126)
BILIRUBIN TOTAL: 0.4 mg/dL (ref 0.3–1.2)
Bilirubin, Direct: 0.2 mg/dL (ref 0.1–0.5)
Bilirubin, Direct: 0.1 mg/dL (ref 0.1–0.5)
Indirect Bilirubin: 0.4 mg/dL (ref 0.3–0.9)
Indirect Bilirubin: 0.3 mg/dL (ref 0.3–0.9)
TOTAL PROTEIN: 3.8 g/dL — AB (ref 6.5–8.1)
TOTAL PROTEIN: 5.3 g/dL — AB (ref 6.5–8.1)
Total Bilirubin: 0.6 mg/dL (ref 0.3–1.2)

## 2015-05-13 LAB — BLOOD GAS, ARTERIAL
Acid-base deficit: 6 mmol/L — ABNORMAL HIGH (ref 0.0–2.0)
Bicarbonate: 18.9 mEq/L — ABNORMAL LOW (ref 20.0–24.0)
DRAWN BY: 398981
FIO2: 0.8
MECHVT: 590 mL
O2 Saturation: 89.2 %
PEEP: 5 cmH2O
PH ART: 7.32 — AB (ref 7.350–7.450)
PO2 ART: 67.1 mmHg — AB (ref 80.0–100.0)
Patient temperature: 98.6
RATE: 30 resp/min
TCO2: 20.1 mmol/L (ref 0–100)
pCO2 arterial: 37.9 mmHg (ref 35.0–45.0)

## 2015-05-13 LAB — GLUCOSE, CAPILLARY
GLUCOSE-CAPILLARY: 126 mg/dL — AB (ref 65–99)
GLUCOSE-CAPILLARY: 140 mg/dL — AB (ref 65–99)
GLUCOSE-CAPILLARY: 168 mg/dL — AB (ref 65–99)
Glucose-Capillary: 141 mg/dL — ABNORMAL HIGH (ref 65–99)
Glucose-Capillary: 160 mg/dL — ABNORMAL HIGH (ref 65–99)
Glucose-Capillary: 182 mg/dL — ABNORMAL HIGH (ref 65–99)

## 2015-05-13 LAB — LACTIC ACID, PLASMA: Lactic Acid, Venous: 2.7 mmol/L (ref 0.5–2.0)

## 2015-05-13 LAB — PATHOLOGIST SMEAR REVIEW

## 2015-05-13 LAB — CORTISOL: CORTISOL PLASMA: 51.6 ug/dL

## 2015-05-13 LAB — HEPARIN LEVEL (UNFRACTIONATED): Heparin Unfractionated: 0.49 IU/mL (ref 0.30–0.70)

## 2015-05-13 LAB — TROPONIN I
TROPONIN I: 0.1 ng/mL — AB (ref <0.031)
TROPONIN I: 0.98 ng/mL — AB (ref <0.031)
Troponin I: 0.89 ng/mL (ref <0.031)

## 2015-05-13 LAB — MAGNESIUM: MAGNESIUM: 2.3 mg/dL (ref 1.7–2.4)

## 2015-05-13 LAB — PROCALCITONIN
PROCALCITONIN: 23.86 ng/mL
PROCALCITONIN: 128.51 ng/mL

## 2015-05-13 LAB — PROTIME-INR
INR: 1.43 (ref 0.00–1.49)
Prothrombin Time: 17.5 seconds — ABNORMAL HIGH (ref 11.6–15.2)

## 2015-05-13 LAB — PHOSPHORUS: Phosphorus: 4.5 mg/dL (ref 2.5–4.6)

## 2015-05-13 MED ORDER — ANTISEPTIC ORAL RINSE SOLUTION (CORINZ)
7.0000 mL | OROMUCOSAL | Status: DC
  Administered 2015-05-13 – 2015-05-20 (×77): 7 mL via OROMUCOSAL

## 2015-05-13 MED ORDER — HEPARIN (PORCINE) IN NACL 100-0.45 UNIT/ML-% IJ SOLN
1200.0000 [IU]/h | INTRAMUSCULAR | Status: DC
  Administered 2015-05-13: 1200 [IU]/h via INTRAVENOUS
  Filled 2015-05-13 (×2): qty 250

## 2015-05-13 MED ORDER — CHLORHEXIDINE GLUCONATE 0.12% ORAL RINSE (MEDLINE KIT)
15.0000 mL | Freq: Two times a day (BID) | OROMUCOSAL | Status: DC
  Administered 2015-05-13 – 2015-05-20 (×15): 15 mL via OROMUCOSAL

## 2015-05-13 MED ORDER — DEXTROSE 5 % IV SOLN
0.0000 ug/min | INTRAVENOUS | Status: DC
  Administered 2015-05-13 (×3): 40 ug/min via INTRAVENOUS
  Administered 2015-05-14: 24 ug/min via INTRAVENOUS
  Administered 2015-05-14: 28 ug/min via INTRAVENOUS
  Administered 2015-05-15: 15 ug/min via INTRAVENOUS
  Filled 2015-05-13 (×6): qty 16

## 2015-05-13 MED ORDER — VASOPRESSIN 20 UNIT/ML IV SOLN
0.0300 [IU]/min | INTRAVENOUS | Status: DC
  Administered 2015-05-13 – 2015-05-14 (×2): 0.03 [IU]/min via INTRAVENOUS
  Filled 2015-05-13 (×3): qty 2

## 2015-05-13 MED ORDER — MAGNESIUM SULFATE 2 GM/50ML IV SOLN
2.0000 g | Freq: Once | INTRAVENOUS | Status: AC
  Administered 2015-05-13: 2 g via INTRAVENOUS
  Filled 2015-05-13: qty 50

## 2015-05-13 MED ORDER — HEPARIN BOLUS VIA INFUSION
2000.0000 [IU] | Freq: Once | INTRAVENOUS | Status: AC
  Administered 2015-05-13: 2000 [IU] via INTRAVENOUS
  Filled 2015-05-13: qty 2000

## 2015-05-13 NOTE — Progress Notes (Signed)
Family meeting with attending MD, wife, son and daughter in attendance to discuss goals of care.  Family desires to continue aggressive care at this time, and are agreeable to tracheostomy placement for airway protection.  They wish to keep code status at current level; wife expressed multiple times that she does not want pt to be "a vegetable."   Discussed possibility of vent SNF if pt unable to wean from ventilator, and locations of vent SNFs in [place] and [place].   Also discussed possible dc to SNF on trach collar, should pt wean from vent.  Family verbalized understanding of these options.    Family appreciative of meeting and the care their loved one is receiving.  Will continue to follow progress.  Quintella Baton, RN, BSN  Trauma/Neuro ICU Case Manager [phone]

## 2015-05-13 NOTE — Code Documentation (Signed)
Called per floor RN at 1940 regarding Patient with acute SOB, tachypnea and "noisy respirations." Advised RN to place order for CXR if not already done while RRT en route. Upon my arrival at 1944 Pt found restless, tachypneic RR 40s, Po2 65%,wet crackles heard without stethoscope needed. NRB placed on Patient. Pt with witnessed tube feed emesis, oral suction provided. Tim RT called to bedside.  Pt HR dropped to 45-50 SB and respirations became agonal Code blue called at 1950. Dr. Derrell Lolling paged to bedside per floor RN. Pt intubated Per RT with CRNA at bedside at 2000. Levophed Gtt started for hypotension and Pt transferred to 3 M at 2020. Bedside report provided by floor RN.
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
## 2015-05-13 NOTE — Progress Notes (Signed)
Orthopedic Tech Progress Note Patient Details:  [name] Mar 10, 1934 [id number]  Patient ID: Ervin Knack, male   DOB: 06-13-1933, 82 y.o.   MRN: [id number] Called in bio-tech brace order; spoke with Anderson Malta, Maven Rosander 05/13/2015, 9:38 AM

## 2015-05-13 NOTE — Progress Notes (Signed)
VASCULAR LAB PRELIMINARY  PRELIMINARY  PRELIMINARY  PRELIMINARY   Bilateral lower extremity venous doppler has been completed.    Bilateral:  No evidence of DVT, superficial thrombosis, or Baker's Cyst.   Jenetta Loges, RVT, RDMS 05/13/2015, 11:25 AM

## 2015-05-13 NOTE — Progress Notes (Signed)
Follow up - Trauma and Critical Care  Patient Details:    [name] is an 82 y.o. male.  Lines/tubes : Airway 7.5 mm (Active)  Secured at (cm) 27 cm 05/13/2015  8:02 AM  Measured From Lips 05/13/2015  8:02 AM  Secured Location Right 05/13/2015  8:02 AM  Secured By [employer] 05/13/2015  8:02 AM  Tube Holder Repositioned Yes 05/13/2015  8:02 AM  Cuff Pressure (cm H2O) 25 cm H2O 05/13/2015  4:44 AM  Site Condition Dry 05/13/2015  8:02 AM     PICC Triple Lumen 04/19/15 PICC Right Basilic 37 cm 0 cm (Active)  Indication for Insertion or Continuance of Line Vasoactive infusions 05/13/2015  8:00 AM  Exposed Catheter (cm) 0 cm 05/07/2015  6:00 AM  Site Assessment Clean;Dry;Intact 05/13/2015  8:00 AM  Lumen #1 Status Infusing 05/13/2015  8:00 AM  Lumen #2 Status Infusing 05/13/2015  8:00 AM  Lumen #3 Status Infusing 05/13/2015  8:00 AM  Dressing Type Transparent;Securing device 05/13/2015  8:00 AM  Dressing Status Clean;Dry;Intact;Antimicrobial disc in place 05/13/2015  8:00 AM  Line Care Tubing changed 05/13/2015  1:00 AM  Line Adjustment (NICU/IV Team Only) No 04/19/2015 12:57 PM  Dressing Intervention New dressing 05/07/2015  6:00 AM  Dressing Change Due 05/14/15 05/13/2015  8:00 AM     Arterial Line 05/12/15 Right Radial (Active)  Site Assessment Clean;Dry;Intact 05/13/2015  8:00 AM  Line Status Pulsatile blood flow 05/13/2015  8:00 AM  Art Line Waveform Appropriate 05/13/2015  8:00 AM  Art Line Interventions Zeroed and calibrated;Leveled;Connections checked and tightened;Flushed per protocol;Line pulled back 05/13/2015  8:00 AM  Color/Movement/Sensation Capillary refill less than 3 sec 05/13/2015  8:00 AM  Dressing Type Transparent;Occlusive 05/13/2015  8:00 AM  Dressing Status Clean;Dry;Intact;Antimicrobial disc in place 05/13/2015  8:00 AM  Interventions New dressing 05/13/2015  1:00 AM     Gastrostomy/Enterostomy Percutaneous endoscopic gastrostomy (PEG) 24 Fr. LUQ (Active)  Surrounding Skin Intact 05/13/2015   8:00 AM  Tube Status Clamped 05/13/2015  8:00 AM  Drainage Appearance Tan 05/12/2015  9:00 PM  Dressing Status Clean;Dry;Intact 05/13/2015  8:00 AM  Dressing Intervention Dressing reinforced 05/12/2015  9:00 PM  Dressing Type Abdominal Binder 05/13/2015  8:00 AM  G Port Intake (mL) 120 ml 05/08/2015  9:55 PM     Urethral Catheter Taryn H Straight-tip (Active)  Indication for Insertion or Continuance of Catheter Unstable critical patients (first 24-48 hours) 05/13/2015  8:00 AM  Site Assessment Clean;Intact 05/13/2015  8:00 AM  Catheter Maintenance Bag below level of bladder;Insertion date on drainage bag;Catheter secured;No dependent loops;Drainage bag/tubing not touching floor;Seal intact 05/13/2015  8:00 AM  Collection Container Standard drainage bag 05/13/2015  8:00 AM  Securement Method Securing device (Describe) 05/13/2015  8:00 AM  Urinary Catheter Interventions Unclamped 05/13/2015  8:00 AM  Output (mL) 70 mL 05/13/2015  8:30 AM    Microbiology/Sepsis markers: Results for orders placed or performed during the hospital encounter of 04/12/15  MRSA PCR Screening     Status: None   Collection Time: 04/12/15  7:04 AM  Result Value Ref Range Status   MRSA by PCR NEGATIVE NEGATIVE Final    Comment:        The GeneXpert MRSA Assay (FDA approved for NASAL specimens only), is one component of a comprehensive MRSA colonization surveillance program. It is not intended to diagnose MRSA infection nor to guide or monitor treatment for MRSA infections.   Culture, respiratory (NON-Expectorated)     Status: None  Collection Time: 04/20/15  8:39 AM  Result Value Ref Range Status   Specimen Description TRACHEAL ASPIRATE  Final   Special Requests Normal  Final   Gram Stain   Final    ABUNDANT WBC PRESENT,BOTH PMN AND MONONUCLEAR NO SQUAMOUS EPITHELIAL CELLS SEEN NO ORGANISMS SEEN Performed at [facility]    Culture   Final    NORMAL OROPHARYNGEAL FLORA Performed at [facility]     Report Status 04/22/2015 FINAL  Final  Culture, respiratory (NON-Expectorated)     Status: None   Collection Time: 04/23/15  2:45 PM  Result Value Ref Range Status   Specimen Description TRACHEAL ASPIRATE  Final   Special Requests NONE  Final   Gram Stain   Final    MODERATE WBC PRESENT,BOTH PMN AND MONONUCLEAR FEW SQUAMOUS EPITHELIAL CELLS PRESENT MODERATE GRAM POSITIVE COCCI IN PAIRS IN CLUSTERS Performed at [facility]    Culture   Final    NORMAL OROPHARYNGEAL FLORA Performed at [facility]    Report Status 04/26/2015 FINAL  Final  Culture, bal-quantitative     Status: None   Collection Time: 04/23/15  3:43 PM  Result Value Ref Range Status   Specimen Description BRONCHIAL ALVEOLAR LAVAGE  Final   Special Requests NONE  Final   Gram Stain   Final    FEW WBC PRESENT, PREDOMINANTLY MONONUCLEAR ABUNDANT SQUAMOUS EPITHELIAL CELLS PRESENT MODERATE GRAM POSITIVE COCCI IN PAIRS IN CLUSTERS Performed at [facility]   Final    >=100,000 COLONIES/ML Performed at [facility]    Culture   Final    NORMAL OROPHARYNGEAL FLORA Performed at [facility]    Report Status 04/26/2015 FINAL  Final    Anti-infectives:  Anti-infectives    Start     Dose/Rate Route Frequency Ordered Stop   05/12/15 2300  vancomycin (VANCOCIN) 1,250 mg in sodium chloride 0.9 % 250 mL IVPB     1,250 mg 166.7 mL/hr over 90 Minutes Intravenous Every 24 hours 05/12/15 2244     05/12/15 2300  cefTAZidime (FORTAZ) 1 g in dextrose 5 % 50 mL IVPB     1 g 100 mL/hr over 30 Minutes Intravenous Every 12 hours 05/12/15 2246     05/12/15 2245  ceFEPIme (MAXIPIME) 2 g in dextrose 5 % 50 mL IVPB  Status:  Discontinued     2 g 100 mL/hr over 30 Minutes Intravenous Every 12 hours 05/12/15 2243 05/12/15 2245   05/01/15 0416  ceFEPIme (MAXIPIME) 2 g in dextrose 5 % 50 mL IVPB     2 g 100 mL/hr over 30 Minutes Intravenous Every 24 hours 04/30/15 1352 05/02/15  0507   04/27/15 0400  ceFEPIme (MAXIPIME) 2 g in dextrose 5 % 50 mL IVPB  Status:  Discontinued     2 g 100 mL/hr over 30 Minutes Intravenous Every 24 hours 04/26/15 0814 04/26/15 0833   04/26/15 2000  vancomycin (VANCOCIN) IVPB 750 mg/150 ml premix  Status:  Discontinued     750 mg 150 mL/hr over 60 Minutes Intravenous Every 12 hours 04/26/15 0817 04/27/15 0900   04/26/15 1600  ceFEPIme (MAXIPIME) 2 g in dextrose 5 % 50 mL IVPB  Status:  Discontinued     2 g 100 mL/hr over 30 Minutes Intravenous Every 12 hours 04/26/15 0833 04/30/15 1352   04/24/15 2000  vancomycin (VANCOCIN) 1,250 mg in sodium chloride 0.9 % 250 mL IVPB  Status:  Discontinued  1,250 mg 166.7 mL/hr over 90 Minutes Intravenous Every 12 hours 04/24/15 0805 04/26/15 0817   04/24/15 1300  ceFEPIme (MAXIPIME) 2 g in dextrose 5 % 50 mL IVPB  Status:  Discontinued     2 g 100 mL/hr over 30 Minutes Intravenous Every 8 hours 04/24/15 0813 04/26/15 0814   04/23/15 1600  vancomycin (VANCOCIN) IVPB 750 mg/150 ml premix  Status:  Discontinued     750 mg 150 mL/hr over 60 Minutes Intravenous Every 12 hours 04/23/15 1513 04/24/15 0805   04/23/15 1530  ceFEPIme (MAXIPIME) 2 g in dextrose 5 % 50 mL IVPB  Status:  Discontinued     2 g 100 mL/hr over 30 Minutes Intravenous Every 12 hours 04/23/15 1504 04/24/15 0813   04/20/15 2200  ceFEPIme (MAXIPIME) 2 g in dextrose 5 % 50 mL IVPB  Status:  Discontinued     2 g 100 mL/hr over 30 Minutes Intravenous Every 12 hours 04/20/15 1124 04/22/15 0847   04/14/15 2230  vancomycin (VANCOCIN) IVPB 750 mg/150 ml premix  Status:  Discontinued     750 mg 150 mL/hr over 60 Minutes Intravenous Every 12 hours 04/14/15 1508 04/15/15 1130   04/14/15 1338  vancomycin (VANCOCIN) 1000 MG powder  Status:  Discontinued    Comments:  Ratcliff, Esther   : cabinet override      04/14/15 1338 04/14/15 1343   04/14/15 1213  vancomycin (VANCOCIN) powder  Status:  Discontinued       As needed 04/14/15 1214 04/14/15  1333   04/14/15 1211  vancomycin (VANCOCIN) 1000 MG powder    Comments:  Ratcliff, Esther   : cabinet override      04/14/15 1211 04/15/15 0014   04/14/15 1147  bacitracin 50,000 Units in sodium chloride irrigation 0.9 % 500 mL irrigation  Status:  Discontinued       As needed 04/14/15 1147 04/14/15 1333   04/14/15 0600  vancomycin (VANCOCIN) IVPB 1000 mg/200 mL premix     1,000 mg 200 mL/hr over 60 Minutes Intravenous To Neuro OR-Station #32 04/13/15 1602 04/14/15 1145   04/13/15 1100  ceFEPIme (MAXIPIME) 2 g in dextrose 5 % 50 mL IVPB  Status:  Discontinued     2 g 100 mL/hr over 30 Minutes Intravenous Every 24 hours 04/13/15 1017 04/20/15 1124   04/12/15 0445  ceFAZolin (ANCEF) IVPB 1 g/50 mL premix     1 g 100 mL/hr over 30 Minutes Intravenous  Once 04/12/15 0430 04/12/15 0535      Best Practice/Protocols:  VTE Prophylaxis: Mechanical GI Prophylaxis: Proton Pump Inhibitor Intermittent Sedation Continous Sedation multiple pressors including dopamine, Vasopressin and Levo  Consults:      Events:  Subjective:    Overnight Issues: Aspirated and became septic overnight.  Now requiring a lot of support on the ventilator and otherwise.  Objective:  Vital signs for last 24 hours: Temp:  [98.3 F (36.8 C)-101.4 F (38.6 C)] 98.8 F (37.1 C) (02/01 0733) Pulse Rate:  [47-154] 113 (02/01 0900) Resp:  [20-51] 30 (02/01 0900) BP: (60-164)/(37-95) 126/84 mmHg (02/01 0900) SpO2:  [65 %-100 %] 98 % (02/01 0900) Arterial Line BP: (60-185)/(37-72) 127/71 mmHg (02/01 0900) FiO2 (%):  [80 %-100 %] 80 % (02/01 0802) Weight:  [80.7 kg (177 lb 14.6 oz)-86.501 kg (190 lb 11.2 oz)] 86.501 kg (190 lb 11.2 oz) (02/01 0430)  Hemodynamic parameters for last 24 hours: CVP:  [9 mmHg-11 mmHg] 11 mmHg  Intake/Output from previous day: 01/31 0701 - 02/01  0700 In: 6816.3 [I.V.:6466.3; IV Piggyback:350] Out: 250 [Urine:250]  Intake/Output this shift: Total I/O In: 438.6 [I.V.:438.6] Out:  70 [Urine:70]  Vent settings for last 24 hours: Vent Mode:  [-] PRVC FiO2 (%):  [80 %-100 %] 80 % Set Rate:  [15 bmp-30 bmp] 30 bmp Vt Set:  [590 mL] 590 mL PEEP:  [8 cmH20-10 cmH20] 10 cmH20 Plateau Pressure:  [20 cmH20-24 cmH20] 24 cmH20  Physical Exam:  General: overbreathing o the ventilator a bit Neuro: RASS -2 Resp: clear to auscultation bilaterally and but overbreathing CVS: Sinus tachycardia with hypotension, likely from sepsis GI: soft, nontender, BS WNL, no r/g and Not start tube feedings yet Extremities: no edema, no erythema, pulses WNL  Results for orders placed or performed during the hospital encounter of 04/12/15 (from the past 24 hour(s))  Glucose, capillary     Status: Abnormal   Collection Time: 05/12/15 11:45 AM  Result Value Ref Range   Glucose-Capillary 112 (H) 65 - 99 mg/dL  Glucose, capillary     Status: Abnormal   Collection Time: 05/12/15  4:22 PM  Result Value Ref Range   Glucose-Capillary 149 (H) 65 - 99 mg/dL  CBC     Status: Abnormal   Collection Time: 05/12/15  8:15 PM  Result Value Ref Range   WBC 3.8 (L) 4.0 - 10.5 K/uL   RBC 4.07 (L) 4.22 - 5.81 MIL/uL   Hemoglobin 11.7 (L) 13.0 - 17.0 g/dL   HCT 21.3 08.6 - 57.8 %   MCV 96.3 78.0 - 100.0 fL   MCH 28.7 26.0 - 34.0 pg   MCHC 29.8 (L) 30.0 - 36.0 g/dL   RDW 46.9 62.9 - 52.8 %   Platelets 328 150 - 400 K/uL  Basic metabolic panel     Status: Abnormal   Collection Time: 05/12/15  8:15 PM  Result Value Ref Range   Sodium 145 135 - 145 mmol/L   Potassium 4.4 3.5 - 5.1 mmol/L   Chloride 109 101 - 111 mmol/L   CO2 20 (L) 22 - 32 mmol/L   Glucose, Bld 160 (H) 65 - 99 mg/dL   BUN 42 (H) 6 - 20 mg/dL   Creatinine, Ser 4.13 (H) 0.61 - 1.24 mg/dL   Calcium 9.4 8.9 - 24.4 mg/dL   GFR calc non Af Amer 34 (L) >60 mL/min   GFR calc Af Amer 40 (L) >60 mL/min   Anion gap 16 (H) 5 - 15  Magnesium     Status: None   Collection Time: 05/12/15  8:15 PM  Result Value Ref Range   Magnesium 2.3 1.7 -  2.4 mg/dL  Phosphorus     Status: None   Collection Time: 05/12/15  8:15 PM  Result Value Ref Range   Phosphorus 4.2 2.5 - 4.6 mg/dL  Troponin I (q 6hr x 3)     Status: Abnormal   Collection Time: 05/12/15  8:15 PM  Result Value Ref Range   Troponin I 0.05 (H) <0.031 ng/mL  Blood gas, arterial     Status: Abnormal   Collection Time: 05/12/15  8:36 PM  Result Value Ref Range   FIO2 1.00    Delivery systems VENTILATOR    Mode PRESSURE REGULATED VOLUME CONTROL    VT 590 mL   LHR 15 resp/min   Peep/cpap 10.0 cm H20   pH, Arterial 7.212 (L) 7.350 - 7.450   pCO2 arterial 48.9 (H) 35.0 - 45.0 mmHg   pO2, Arterial 61.5 (L) 80.0 - 100.0  mmHg   Bicarbonate 18.9 (L) 20.0 - 24.0 mEq/L   TCO2 20.4 0 - 100 mmol/L   Acid-base deficit 7.6 (H) 0.0 - 2.0 mmol/L   O2 Saturation 81.1 %   Patient temperature 98.6    Collection site RIGHT RADIAL    Drawn by [id number]    Sample type ARTERIAL DRAW    Allens test (pass/fail) PASS PASS  Glucose, capillary     Status: Abnormal   Collection Time: 05/12/15  9:02 PM  Result Value Ref Range   Glucose-Capillary 126 (H) 65 - 99 mg/dL  Blood gas, arterial     Status: Abnormal   Collection Time: 05/12/15 10:20 PM  Result Value Ref Range   FIO2 0.80    Delivery systems VENTILATOR    Mode PRESSURE REGULATED VOLUME CONTROL    VT 590 mL   LHR 15 resp/min   Peep/cpap 5.0 cm H20   pH, Arterial 7.255 (L) 7.350 - 7.450   pCO2 arterial 44.7 35.0 - 45.0 mmHg   pO2, Arterial 62.0 (L) 80.0 - 100.0 mmHg   Bicarbonate 19.2 (L) 20.0 - 24.0 mEq/L   TCO2 20.5 0 - 100 mmol/L   Acid-base deficit 6.7 (H) 0.0 - 2.0 mmol/L   O2 Saturation 83.5 %   Patient temperature 98.6    Collection site ARTERIAL LINE    Drawn by COLLECTED BY RT    Sample type ARTERIAL DRAW    Allens test (pass/fail) PASS PASS  Lactic acid, plasma     Status: Abnormal   Collection Time: 05/12/15 11:09 PM  Result Value Ref Range   Lactic Acid, Venous 2.7 (HH) 0.5 - 2.0 mmol/L  Troponin I      Status: Abnormal   Collection Time: 05/12/15 11:20 PM  Result Value Ref Range   Troponin I 0.10 (H) <0.031 ng/mL  Hepatic function panel     Status: Abnormal   Collection Time: 05/12/15 11:20 PM  Result Value Ref Range   Total Protein 3.8 (L) 6.5 - 8.1 g/dL   Albumin 1.7 (L) 3.5 - 5.0 g/dL   AST 24 15 - 41 U/L   ALT 23 17 - 63 U/L   Alkaline Phosphatase 74 38 - 126 U/L   Total Bilirubin 0.6 0.3 - 1.2 mg/dL   Bilirubin, Direct 0.2 0.1 - 0.5 mg/dL   Indirect Bilirubin 0.4 0.3 - 0.9 mg/dL  Procalcitonin - Baseline     Status: None   Collection Time: 05/12/15 11:20 PM  Result Value Ref Range   Procalcitonin 23.86 ng/mL  Cortisol     Status: None   Collection Time: 05/12/15 11:20 PM  Result Value Ref Range   Cortisol, Plasma 51.6 ug/dL  Glucose, capillary     Status: Abnormal   Collection Time: 05/12/15 11:45 PM  Result Value Ref Range   Glucose-Capillary 126 (H) 65 - 99 mg/dL  Blood gas, arterial     Status: Abnormal   Collection Time: 05/13/15 12:53 AM  Result Value Ref Range   FIO2 0.80    Delivery systems VENTILATOR    Mode PRESSURE REGULATED VOLUME CONTROL    VT 590 mL   LHR 30 resp/min   Peep/cpap 5.0 cm H20   pH, Arterial 7.320 (L) 7.350 - 7.450   pCO2 arterial 37.9 35.0 - 45.0 mmHg   pO2, Arterial 67.1 (L) 80.0 - 100.0 mmHg   Bicarbonate 18.9 (L) 20.0 - 24.0 mEq/L   TCO2 20.1 0 - 100 mmol/L   Acid-base deficit 6.0 (H) 0.0 -  2.0 mmol/L   O2 Saturation 89.2 %   Patient temperature 98.6    Collection site ARTERIAL LINE    Drawn by [phone]    Sample type ARTERIAL    Allens test (pass/fail) PASS PASS  Glucose, capillary     Status: Abnormal   Collection Time: 05/13/15  3:41 AM  Result Value Ref Range   Glucose-Capillary 141 (H) 65 - 99 mg/dL  Troponin I     Status: Abnormal   Collection Time: 05/13/15  6:30 AM  Result Value Ref Range   Troponin I 0.98 (HH) <0.031 ng/mL  Procalcitonin     Status: None   Collection Time: 05/13/15  6:30 AM  Result Value Ref Range    Procalcitonin 128.51 ng/mL  CBC with Differential/Platelet     Status: Abnormal (Preliminary result)   Collection Time: 05/13/15  6:30 AM  Result Value Ref Range   WBC 8.9 4.0 - 10.5 K/uL   RBC 3.94 (L) 4.22 - 5.81 MIL/uL   Hemoglobin 11.5 (L) 13.0 - 17.0 g/dL   HCT 04.5 (L) 40.9 - 81.1 %   MCV 94.7 78.0 - 100.0 fL   MCH 29.2 26.0 - 34.0 pg   MCHC 30.8 30.0 - 36.0 g/dL   RDW 91.4 78.2 - 95.6 %   Platelets 281 150 - 400 K/uL   Neutrophils Relative % PENDING %   Neutro Abs PENDING 1.7 - 7.7 K/uL   Band Neutrophils PENDING %   Lymphocytes Relative PENDING %   Lymphs Abs PENDING 0.7 - 4.0 K/uL   Monocytes Relative PENDING %   Monocytes Absolute PENDING 0.1 - 1.0 K/uL   Eosinophils Relative PENDING %   Eosinophils Absolute PENDING 0.0 - 0.7 K/uL   Basophils Relative PENDING %   Basophils Absolute PENDING 0.0 - 0.1 K/uL   WBC Morphology PENDING    RBC Morphology PENDING    Smear Review PENDING    nRBC PENDING 0 /100 WBC   Metamyelocytes Relative PENDING %   Myelocytes PENDING %   Promyelocytes Absolute PENDING %   Blasts PENDING %  Magnesium     Status: None   Collection Time: 05/13/15  6:30 AM  Result Value Ref Range   Magnesium 2.3 1.7 - 2.4 mg/dL  Phosphorus     Status: None   Collection Time: 05/13/15  6:30 AM  Result Value Ref Range   Phosphorus 4.5 2.5 - 4.6 mg/dL  Hepatic function panel     Status: Abnormal   Collection Time: 05/13/15  6:30 AM  Result Value Ref Range   Total Protein 5.3 (L) 6.5 - 8.1 g/dL   Albumin 2.1 (L) 3.5 - 5.0 g/dL   AST 39 15 - 41 U/L   ALT 32 17 - 63 U/L   Alkaline Phosphatase 92 38 - 126 U/L   Total Bilirubin 0.4 0.3 - 1.2 mg/dL   Bilirubin, Direct 0.1 0.1 - 0.5 mg/dL   Indirect Bilirubin 0.3 0.3 - 0.9 mg/dL  Protime-INR     Status: Abnormal   Collection Time: 05/13/15  6:30 AM  Result Value Ref Range   Prothrombin Time 17.5 (H) 11.6 - 15.2 seconds   INR 1.43 0.00 - 1.49  Glucose, capillary     Status: Abnormal   Collection Time:  05/13/15  7:30 AM  Result Value Ref Range   Glucose-Capillary 140 (H) 65 - 99 mg/dL  Heparin level (unfractionated)     Status: None   Collection Time: 05/13/15  9:00 AM  Result Value  Ref Range   Heparin Unfractionated 0.49 0.30 - 0.70 IU/mL     Assessment/Plan:   NEURO  Altered Mental Status:  coma   Plan: No specific concerns, will need to clarify with family what direction they want to go.  PULM  Atelectasis/collapse (focal and aspiration pneumonia)   Plan: Treat pneumonia empirically  CARDIO  Sinus Tachycardia   Plan: No specific treatment.  RENAL  Oliguria (possibly due to low CO and suspect ATN)   Plan: Renal function is also poor.  Probably had some renal ischemia during this event  GI  No problems.  Not using PEG curently   Plan: G-tube to gravity  ID  Pneumonia (aspiration and aspiration pneumonitis)   Plan: Will be on empiric treatment.  HEME  Anemia anemia of critical illness)   Plan: No blood needed  ENDO No specific treatment   Plan: CPM  Global Issues  Need to ave discussion with the family about long term goals.  If we are to move forward with this patient he will need tracheostomy.    LOS: 31 days   Additional comments:I reviewed the patient's new clinical lab test results. cbc/bmet and I reviewed the patients new imaging test results. cxr last PM  Critical Care Total Time*: 30 Minutes  Bhavya Eschete 05/13/2015  *Care during the described time interval was provided by me and/or other providers on the critical care team.  I have reviewed this patient's available data, including medical history, events of note, physical examination and test results as part of my evaluation.
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
## 2015-05-13 NOTE — Progress Notes (Signed)
PT Cancellation Note  Patient Details Name: [name] MRN: [id number] DOB: February 23, 1934   Cancelled Treatment:    Reason Eval/Treat Not Completed: Patient not medically ready.  Pt sedated and intubated.  Will hold Pt for now but will continue to follow acutely.  Michail Jewels PT, DPT [phone] Pager: [phone] 05/13/2015, 9:11 AM

## 2015-05-13 NOTE — Progress Notes (Signed)
Orthopedic Tech Progress Note Patient Details:  [name] 1933-11-05 [id number] Brace order completed by bio-tech vendor. Patient ID: [name], male   DOB: [birth year], 82 y.o.   MRN: [id number]   [name] 05/13/2015, 3:04 PM

## 2015-05-13 NOTE — Progress Notes (Signed)
CRITICAL VALUE ALERT  Critical value received:  Troponin, 0.98  Date of notification:  05/13/2015  Time of notification:  0754  Critical value read back:Yes.    Nurse who received alert:  Myrle Sheng  MD notified (1st page):  Trauma MD  Time of first page:  0800  MD notified (2nd page):  Time of second page:  Responding MD:  Dr. Frederik Schmidt  Time MD responded:  [phone]
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
## 2015-05-13 NOTE — Progress Notes (Signed)
CRITICAL VALUE ALERT  Critical value received:  Lactic acid 2.7  Date of notification:  05/13/2015    Time of notification:  0005  Critical value read back:Yes.    Nurse who received alert:  Tilford Pillar  MD notified (1st page):  Molli Knock  Time of first page:  0015  MD notified (2nd page):  Time of second page:  Responding MD:  Molli Knock  Time MD responded:  [phone]

## 2015-05-14 LAB — BASIC METABOLIC PANEL
Anion gap: 14 (ref 5–15)
BUN: 48 mg/dL — AB (ref 6–20)
CALCIUM: 8.4 mg/dL — AB (ref 8.9–10.3)
CO2: 16 mmol/L — ABNORMAL LOW (ref 22–32)
CREATININE: 1.53 mg/dL — AB (ref 0.61–1.24)
Chloride: 113 mmol/L — ABNORMAL HIGH (ref 101–111)
GFR calc non Af Amer: 42 mL/min — ABNORMAL LOW (ref >60)
GFR, EST AFRICAN AMERICAN: 48 mL/min — AB (ref >60)
Glucose, Bld: 137 mg/dL — ABNORMAL HIGH (ref 65–99)
Potassium: 5.2 mmol/L — ABNORMAL HIGH (ref 3.5–5.1)
SODIUM: 143 mmol/L (ref 135–145)

## 2015-05-14 LAB — CBC WITH DIFFERENTIAL/PLATELET
BASOS ABS: 0 10*3/uL (ref 0.0–0.1)
Basophils Relative: 0 %
EOS PCT: 0 %
Eosinophils Absolute: 0 10*3/uL (ref 0.0–0.7)
HEMATOCRIT: 32.7 % — AB (ref 39.0–52.0)
HEMOGLOBIN: 10.3 g/dL — AB (ref 13.0–17.0)
LYMPHS ABS: 0.8 10*3/uL (ref 0.7–4.0)
LYMPHS PCT: 3 %
MCH: 29.6 pg (ref 26.0–34.0)
MCHC: 31.5 g/dL (ref 30.0–36.0)
MCV: 94 fL (ref 78.0–100.0)
MONOS PCT: 5 %
Monocytes Absolute: 1.4 10*3/uL — ABNORMAL HIGH (ref 0.1–1.0)
Neutro Abs: 25.6 10*3/uL — ABNORMAL HIGH (ref 1.7–7.7)
Neutrophils Relative %: 92 %
Platelets: 216 10*3/uL (ref 150–400)
RBC: 3.48 MIL/uL — AB (ref 4.22–5.81)
RDW: 15.8 % — ABNORMAL HIGH (ref 11.5–15.5)
WBC MORPHOLOGY: INCREASED
WBC: 27.8 10*3/uL — AB (ref 4.0–10.5)

## 2015-05-14 LAB — GLUCOSE, CAPILLARY
GLUCOSE-CAPILLARY: 140 mg/dL — AB (ref 65–99)
GLUCOSE-CAPILLARY: 122 mg/dL — AB (ref 65–99)
GLUCOSE-CAPILLARY: 140 mg/dL — AB (ref 65–99)
GLUCOSE-CAPILLARY: 131 mg/dL — AB (ref 65–99)
GLUCOSE-CAPILLARY: 157 mg/dL — AB (ref 65–99)
Glucose-Capillary: 137 mg/dL — ABNORMAL HIGH (ref 65–99)

## 2015-05-14 LAB — CULTURE, BLOOD (ROUTINE X 2)

## 2015-05-14 LAB — URINE CULTURE: Culture: NO GROWTH

## 2015-05-14 LAB — PROCALCITONIN: Procalcitonin: 119.21 ng/mL

## 2015-05-14 LAB — MAGNESIUM: MAGNESIUM: 2.4 mg/dL (ref 1.7–2.4)

## 2015-05-14 LAB — PHOSPHORUS: PHOSPHORUS: 4 mg/dL (ref 2.5–4.6)
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
## 2015-05-14 NOTE — Progress Notes (Signed)
OT Cancellation Note  Patient Details Name: [name] MRN: [id number] DOB: [birth year]   Cancelled Treatment:    Reason Eval/Treat Not Completed: Medical issues which prohibited therapy Pt on several pressors and not medically stable for therapy at this time. OT signing off. Please reorder once pt stabilizes and is able to participate with therapy. Thanks. [facility] Rashana Andrew, OTR/L  [phone] 05/14/2015 05/14/2015, 6:14 AM

## 2015-05-14 NOTE — Progress Notes (Signed)
CRITICAL VALUE ALERT  Critical value received:  + Blood Cultures: GRAM+ COCCI IN CLUSTERS  (Aerobic bottle)  Date of notification:  05/14/2015  Time of notification:  1923  Critical value read back:Yes.    Nurse who received alert:  Everette Rank, RN  MD notified (1st page):  Pola Corn  Time of first page:  1940  MD notified (2nd page):  Time of second page:  Responding MD:  Pola Corn  Time MD responded:  1940  Left message with Mindi Junker, RN at [facility] - to notify Dr. Vassie Loll of results and pt currently on vancomycin and fortaz.

## 2015-05-14 NOTE — Progress Notes (Signed)
PT Cancellation Note  Patient Details Name: [name] MRN: [id number] DOB: 11/16/1933   Cancelled Treatment:    Reason Eval/Treat Not Completed: Patient not medically ready. Pt on several pressors and not medically stable for therapy at this time. Will hold PT for now but will continue to follow acutely.  Michail Jewels PT, DPT [phone] Pager: [phone] 05/14/2015, 9:52 AM
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
## 2015-05-15 ENCOUNTER — Inpatient Hospital Stay (HOSPITAL_COMMUNITY): Payer: No Typology Code available for payment source

## 2015-05-15 LAB — BLOOD GAS, ARTERIAL
Acid-base deficit: 6.9 mmol/L — ABNORMAL HIGH (ref 0.0–2.0)
BICARBONATE: 16.4 meq/L — AB (ref 20.0–24.0)
Drawn by: 441371
FIO2: 0.4
LHR: 30 {breaths}/min
O2 Saturation: 98.7 %
PEEP: 10 cmH2O
PO2 ART: 115 mmHg — AB (ref 80.0–100.0)
Patient temperature: 97.8
TCO2: 17.1 mmol/L (ref 0–100)
VT: 590 mL
pCO2 arterial: 23.2 mmHg — ABNORMAL LOW (ref 35.0–45.0)
pH, Arterial: 7.461 — ABNORMAL HIGH (ref 7.350–7.450)

## 2015-05-15 LAB — CBC WITH DIFFERENTIAL/PLATELET
BASOS ABS: 0 10*3/uL (ref 0.0–0.1)
Basophils Relative: 0 %
EOS ABS: 0 10*3/uL (ref 0.0–0.7)
Eosinophils Relative: 0 %
HCT: 29.1 % — ABNORMAL LOW (ref 39.0–52.0)
Hemoglobin: 9.1 g/dL — ABNORMAL LOW (ref 13.0–17.0)
LYMPHS ABS: 1.2 10*3/uL (ref 0.7–4.0)
Lymphocytes Relative: 4 %
MCH: 29.1 pg (ref 26.0–34.0)
MCHC: 31.3 g/dL (ref 30.0–36.0)
MCV: 93 fL (ref 78.0–100.0)
Monocytes Absolute: 0.9 10*3/uL (ref 0.1–1.0)
Monocytes Relative: 3 %
NEUTROS ABS: 27 10*3/uL — AB (ref 1.7–7.7)
Neutrophils Relative %: 93 %
PLATELETS: 170 10*3/uL (ref 150–400)
RBC: 3.13 MIL/uL — ABNORMAL LOW (ref 4.22–5.81)
RDW: 16.1 % — AB (ref 11.5–15.5)
WBC: 29.1 10*3/uL — ABNORMAL HIGH (ref 4.0–10.5)

## 2015-05-15 LAB — GLUCOSE, CAPILLARY
GLUCOSE-CAPILLARY: 144 mg/dL — AB (ref 65–99)
GLUCOSE-CAPILLARY: 140 mg/dL — AB (ref 65–99)
Glucose-Capillary: 132 mg/dL — ABNORMAL HIGH (ref 65–99)
Glucose-Capillary: 135 mg/dL — ABNORMAL HIGH (ref 65–99)
Glucose-Capillary: 137 mg/dL — ABNORMAL HIGH (ref 65–99)
Glucose-Capillary: 138 mg/dL — ABNORMAL HIGH (ref 65–99)

## 2015-05-15 LAB — BASIC METABOLIC PANEL
Anion gap: 10 (ref 5–15)
BUN: 50 mg/dL — ABNORMAL HIGH (ref 6–20)
CALCIUM: 8.5 mg/dL — AB (ref 8.9–10.3)
CO2: 17 mmol/L — AB (ref 22–32)
CREATININE: 1.19 mg/dL (ref 0.61–1.24)
Chloride: 113 mmol/L — ABNORMAL HIGH (ref 101–111)
GFR calc Af Amer: >60 mL/min (ref >60)
GFR calc non Af Amer: 56 mL/min — ABNORMAL LOW (ref >60)
GLUCOSE: 157 mg/dL — AB (ref 65–99)
Potassium: 4.4 mmol/L (ref 3.5–5.1)
Sodium: 140 mmol/L (ref 135–145)

## 2015-05-15 LAB — PHOSPHORUS: Phosphorus: 2.7 mg/dL (ref 2.5–4.6)

## 2015-05-15 LAB — MAGNESIUM: MAGNESIUM: 2.4 mg/dL (ref 1.7–2.4)

## 2015-05-15 MED ORDER — METOPROLOL TARTRATE 1 MG/ML IV SOLN
5.0000 mg | Freq: Once | INTRAVENOUS | Status: AC
  Administered 2015-05-15: 5 mg via INTRAVENOUS

## 2015-05-15 MED ORDER — DILTIAZEM HCL 100 MG IV SOLR
5.0000 mg/h | INTRAVENOUS | Status: DC
  Administered 2015-05-15: 7.5 mg/h via INTRAVENOUS
  Administered 2015-05-15 – 2015-05-16 (×2): 5 mg/h via INTRAVENOUS
  Administered 2015-05-17: 7.5 mg/h via INTRAVENOUS
  Administered 2015-05-17: 5 mg/h via INTRAVENOUS
  Administered 2015-05-18: 7.5 mg/h via INTRAVENOUS
  Administered 2015-05-18: 12 mg/h via INTRAVENOUS
  Administered 2015-05-19: 12.5 mg/h via INTRAVENOUS
  Administered 2015-05-19: 10 mg/h via INTRAVENOUS
  Filled 2015-05-15 (×9): qty 100

## 2015-05-15 MED ORDER — JEVITY 1.2 CAL PO LIQD
1000.0000 mL | ORAL | Status: DC
  Administered 2015-05-15 – 2015-05-17 (×3): 1000 mL
  Administered 2015-05-17 – 2015-05-18 (×2)
  Filled 2015-05-15 (×3): qty 1000

## 2015-05-15 MED ORDER — CHLORHEXIDINE GLUCONATE 0.12 % MT SOLN
OROMUCOSAL | Status: AC
  Administered 2015-05-15: 15 mL
  Filled 2015-05-15: qty 15

## 2015-05-15 MED ORDER — METOPROLOL TARTRATE 1 MG/ML IV SOLN
INTRAVENOUS | Status: AC
  Filled 2015-05-15: qty 5

## 2015-05-15 MED ORDER — DEXTROSE 5 % IV SOLN
1.0000 g | Freq: Three times a day (TID) | INTRAVENOUS | Status: DC
  Administered 2015-05-15 – 2015-05-16 (×4): 1 g via INTRAVENOUS
  Filled 2015-05-15 (×5): qty 1

## 2015-05-15 MED ORDER — VANCOMYCIN HCL IN DEXTROSE 750-5 MG/150ML-% IV SOLN
750.0000 mg | Freq: Two times a day (BID) | INTRAVENOUS | Status: DC
  Administered 2015-05-15 – 2015-05-17 (×4): 750 mg via INTRAVENOUS
  Filled 2015-05-15 (×5): qty 150

## 2015-05-15 NOTE — Progress Notes (Signed)
Patient still in a.fib with HR 110s-125 after lopressor given. Dr Janee Morn notified and order received to start cardizem gtt.

## 2015-05-15 NOTE — Progress Notes (Signed)
Follow up - Trauma and Critical Care  Patient Details:    [name] is an 82 y.o. male.  Lines/tubes : Airway 7.5 mm (Active)  Secured at (cm) 23 cm 05/15/2015  7:44 AM  Measured From Lips 05/15/2015  7:44 AM  Secured Location Right 05/15/2015  7:44 AM  Secured By [employer] 05/15/2015  7:44 AM  Tube Holder Repositioned Yes 05/15/2015  7:44 AM  Cuff Pressure (cm H2O) 26 cm H2O 05/14/2015  7:17 PM  Site Condition Dry 05/15/2015  7:44 AM     PICC Triple Lumen 04/19/15 PICC Right Basilic 37 cm 0 cm (Active)  Indication for Insertion or Continuance of Line Prolonged intravenous therapies 05/15/2015  7:34 AM  Exposed Catheter (cm) 0 cm 05/07/2015  6:00 AM  Site Assessment Clean;Dry;Intact 05/14/2015  8:00 PM  Lumen #1 Status Infusing;Flushed 05/14/2015  8:00 PM  Lumen #2 Status Infusing;Flushed 05/14/2015  8:00 PM  Lumen #3 Status Other (Comment) 05/14/2015  8:00 PM  Dressing Type Transparent 05/14/2015  8:00 PM  Dressing Status Clean;Dry;Intact;Antimicrobial disc in place 05/14/2015  8:00 PM  Line Care Connections checked and tightened;Zeroed and calibrated;Leveled 05/14/2015  8:00 PM  Line Adjustment (NICU/IV Team Only) No 04/19/2015 12:57 PM  Dressing Intervention New dressing;Antimicrobial disc changed;Dressing changed 05/14/2015  4:30 PM  Dressing Change Due 05/21/15 05/14/2015  8:00 PM     Arterial Line 05/12/15 Right Radial (Active)  Site Assessment Clean;Dry;Intact 05/14/2015  8:00 PM  Line Status Pulsatile blood flow;Positional 05/14/2015  8:00 PM  Art Line Waveform Appropriate 05/14/2015  8:00 PM  Art Line Interventions Zeroed and calibrated;Leveled;Flushed per protocol;Line pulled back;Connections checked and tightened 05/14/2015  8:00 PM  Color/Movement/Sensation Capillary refill less than 3 sec 05/14/2015  8:00 PM  Dressing Type Transparent;Occlusive 05/14/2015  8:00 PM  Dressing Status Clean;Dry;Intact;Antimicrobial disc in place 05/14/2015  8:00 PM  Interventions New dressing 05/13/2015  1:00 AM      Gastrostomy/Enterostomy Percutaneous endoscopic gastrostomy (PEG) 24 Fr. LUQ (Active)  Surrounding Skin Intact 05/14/2015  8:00 PM  Tube Status Open to gravity drainage 05/15/2015  8:00 AM  Drainage Appearance Bile;Brown 05/14/2015  8:00 PM  Dressing Status Clean;Dry;Intact 05/14/2015  8:00 PM  Dressing Intervention Dressing reinforced 05/14/2015  8:00 PM  Dressing Type Split gauze;Abdominal Binder 05/14/2015  8:00 PM  G Port Intake (mL) 120 ml 05/08/2015  9:55 PM  Output (mL) 250 mL 05/15/2015  5:00 AM     Urethral Catheter Taryn H Straight-tip (Active)  Indication for Insertion or Continuance of Catheter Unstable critical patients (first 24-48 hours) 05/15/2015  7:34 AM  Site Assessment Clean;Intact 05/14/2015  8:00 PM  Catheter Maintenance Bag below level of bladder;Catheter secured;Drainage bag/tubing not touching floor;Insertion date on drainage bag;No dependent loops;Seal intact;Bag emptied prior to transport 05/15/2015  7:34 AM  Collection Container Standard drainage bag 05/14/2015  8:00 PM  Securement Method Securing device (Describe) 05/14/2015  8:00 PM  Urinary Catheter Interventions Unclamped 05/14/2015  8:00 PM  Output (mL) 100 mL 05/15/2015  6:00 AM    Microbiology/Sepsis markers: Results for orders placed or performed during the hospital encounter of 04/12/15  MRSA PCR Screening     Status: None   Collection Time: 04/12/15  7:04 AM  Result Value Ref Range Status   MRSA by PCR NEGATIVE NEGATIVE Final    Comment:        The GeneXpert MRSA Assay (FDA approved for NASAL specimens only), is one component of a comprehensive MRSA colonization surveillance program. It is not intended  to diagnose MRSA infection nor to guide or monitor treatment for MRSA infections.   Culture, respiratory (NON-Expectorated)     Status: None   Collection Time: 04/20/15  8:39 AM  Result Value Ref Range Status   Specimen Description TRACHEAL ASPIRATE  Final   Special Requests Normal  Final   Gram Stain   Final     ABUNDANT WBC PRESENT,BOTH PMN AND MONONUCLEAR NO SQUAMOUS EPITHELIAL CELLS SEEN NO ORGANISMS SEEN Performed at [facility]    Culture   Final    NORMAL OROPHARYNGEAL FLORA Performed at [facility]    Report Status 04/22/2015 FINAL  Final  Culture, respiratory (NON-Expectorated)     Status: None   Collection Time: 04/23/15  2:45 PM  Result Value Ref Range Status   Specimen Description TRACHEAL ASPIRATE  Final   Special Requests NONE  Final   Gram Stain   Final    MODERATE WBC PRESENT,BOTH PMN AND MONONUCLEAR FEW SQUAMOUS EPITHELIAL CELLS PRESENT MODERATE GRAM POSITIVE COCCI IN PAIRS IN CLUSTERS Performed at [facility]    Culture   Final    NORMAL OROPHARYNGEAL FLORA Performed at [facility]    Report Status 04/26/2015 FINAL  Final  Culture, bal-quantitative     Status: None   Collection Time: 04/23/15  3:43 PM  Result Value Ref Range Status   Specimen Description BRONCHIAL ALVEOLAR LAVAGE  Final   Special Requests NONE  Final   Gram Stain   Final    FEW WBC PRESENT, PREDOMINANTLY MONONUCLEAR ABUNDANT SQUAMOUS EPITHELIAL CELLS PRESENT MODERATE GRAM POSITIVE COCCI IN PAIRS IN CLUSTERS Performed at [facility]   Final    >=100,000 COLONIES/ML Performed at [facility]    Culture   Final    NORMAL OROPHARYNGEAL FLORA Performed at [facility]    Report Status 04/26/2015 FINAL  Final  Culture, blood (Routine X 2) w Reflex to ID Panel     Status: None   Collection Time: 05/12/15 11:59 PM  Result Value Ref Range Status   Specimen Description BLOOD LEFT ARM  Final   Special Requests IN PEDIATRIC BOTTLE 0.5ML  Final   Culture  Setup Time   Final    GRAM POSITIVE COCCI IN CLUSTERS AEROBIC BOTTLE ONLY CRITICAL RESULT CALLED TO, READ BACK BY AND VERIFIED WITH: Silas Flood RN 1924 05/13/15 A BROWNING    Culture   Final    STAPHYLOCOCCUS SPECIES (COAGULASE NEGATIVE) THE SIGNIFICANCE OF ISOLATING  THIS ORGANISM FROM A SINGLE SET OF BLOOD CULTURES WHEN MULTIPLE SETS ARE DRAWN IS UNCERTAIN. PLEASE NOTIFY THE MICROBIOLOGY DEPARTMENT WITHIN ONE WEEK IF SPECIATION AND SENSITIVITIES ARE REQUIRED.    Report Status 05/14/2015 FINAL  Final  Culture, blood (Routine X 2) w Reflex to ID Panel     Status: None (Preliminary result)   Collection Time: 05/13/15 12:08 AM  Result Value Ref Range Status   Specimen Description BLOOD LEFT ARM  Final   Special Requests IN PEDIATRIC BOTTLE  Final   Culture NO GROWTH 1 DAY  Final   Report Status PENDING  Incomplete  Culture, Urine     Status: None   Collection Time: 05/13/15  3:30 AM  Result Value Ref Range Status   Specimen Description URINE, CATHETERIZED  Final   Special Requests NONE  Final   Culture NO GROWTH 1 DAY  Final   Report Status 05/14/2015 FINAL  Final  Culture, respiratory (NON-Expectorated)  Status: None (Preliminary result)   Collection Time: 05/13/15  4:50 AM  Result Value Ref Range Status   Specimen Description TRACHEAL ASPIRATE  Final   Special Requests NONE  Final   Gram Stain   Final    ABUNDANT WBC PRESENT,BOTH PMN AND MONONUCLEAR NO SQUAMOUS EPITHELIAL CELLS SEEN ABUNDANT GRAM POSITIVE COCCI IN PAIRS IN CLUSTERS FEW GRAM NEGATIVE RODS Performed at [facility]    Culture   Final    Culture reincubated for better growth Performed at [facility]    Report Status PENDING  Incomplete    Anti-infectives:  Anti-infectives    Start     Dose/Rate Route Frequency Ordered Stop   05/15/15 1200  vancomycin (VANCOCIN) IVPB 750 mg/150 ml premix     750 mg 150 mL/hr over 60 Minutes Intravenous Every 12 hours 05/15/15 0742     05/15/15 0900  cefTAZidime (FORTAZ) 1 g in dextrose 5 % 50 mL IVPB     1 g 100 mL/hr over 30 Minutes Intravenous Every 8 hours 05/15/15 0746     05/12/15 2300  vancomycin (VANCOCIN) 1,250 mg in sodium chloride 0.9 % 250 mL IVPB  Status:  Discontinued     1,250 mg 166.7 mL/hr over 90  Minutes Intravenous Every 24 hours 05/12/15 2244 05/15/15 0742   05/12/15 2300  cefTAZidime (FORTAZ) 1 g in dextrose 5 % 50 mL IVPB  Status:  Discontinued     1 g 100 mL/hr over 30 Minutes Intravenous Every 12 hours 05/12/15 2246 05/15/15 0746   05/12/15 2245  ceFEPIme (MAXIPIME) 2 g in dextrose 5 % 50 mL IVPB  Status:  Discontinued     2 g 100 mL/hr over 30 Minutes Intravenous Every 12 hours 05/12/15 2243 05/12/15 2245   05/01/15 0416  ceFEPIme (MAXIPIME) 2 g in dextrose 5 % 50 mL IVPB     2 g 100 mL/hr over 30 Minutes Intravenous Every 24 hours 04/30/15 1352 05/02/15 0507   04/27/15 0400  ceFEPIme (MAXIPIME) 2 g in dextrose 5 % 50 mL IVPB  Status:  Discontinued     2 g 100 mL/hr over 30 Minutes Intravenous Every 24 hours 04/26/15 0814 04/26/15 0833   04/26/15 2000  vancomycin (VANCOCIN) IVPB 750 mg/150 ml premix  Status:  Discontinued     750 mg 150 mL/hr over 60 Minutes Intravenous Every 12 hours 04/26/15 0817 04/27/15 0900   04/26/15 1600  ceFEPIme (MAXIPIME) 2 g in dextrose 5 % 50 mL IVPB  Status:  Discontinued     2 g 100 mL/hr over 30 Minutes Intravenous Every 12 hours 04/26/15 0833 04/30/15 1352   04/24/15 2000  vancomycin (VANCOCIN) 1,250 mg in sodium chloride 0.9 % 250 mL IVPB  Status:  Discontinued     1,250 mg 166.7 mL/hr over 90 Minutes Intravenous Every 12 hours 04/24/15 0805 04/26/15 0817   04/24/15 1300  ceFEPIme (MAXIPIME) 2 g in dextrose 5 % 50 mL IVPB  Status:  Discontinued     2 g 100 mL/hr over 30 Minutes Intravenous Every 8 hours 04/24/15 0813 04/26/15 0814   04/23/15 1600  vancomycin (VANCOCIN) IVPB 750 mg/150 ml premix  Status:  Discontinued     750 mg 150 mL/hr over 60 Minutes Intravenous Every 12 hours 04/23/15 1513 04/24/15 0805   04/23/15 1530  ceFEPIme (MAXIPIME) 2 g in dextrose 5 % 50 mL IVPB  Status:  Discontinued     2 g 100 mL/hr over 30 Minutes Intravenous Every 12 hours  04/23/15 1504 04/24/15 0813   04/20/15 2200  ceFEPIme (MAXIPIME) 2 g in dextrose 5  % 50 mL IVPB  Status:  Discontinued     2 g 100 mL/hr over 30 Minutes Intravenous Every 12 hours 04/20/15 1124 04/22/15 0847   04/14/15 2230  vancomycin (VANCOCIN) IVPB 750 mg/150 ml premix  Status:  Discontinued     750 mg 150 mL/hr over 60 Minutes Intravenous Every 12 hours 04/14/15 1508 04/15/15 1130   04/14/15 1338  vancomycin (VANCOCIN) 1000 MG powder  Status:  Discontinued    Comments:  Ratcliff, Esther   : cabinet override      04/14/15 1338 04/14/15 1343   04/14/15 1213  vancomycin (VANCOCIN) powder  Status:  Discontinued       As needed 04/14/15 1214 04/14/15 1333   04/14/15 1211  vancomycin (VANCOCIN) 1000 MG powder    Comments:  Ratcliff, Esther   : cabinet override      04/14/15 1211 04/15/15 0014   04/14/15 1147  bacitracin 50,000 Units in sodium chloride irrigation 0.9 % 500 mL irrigation  Status:  Discontinued       As needed 04/14/15 1147 04/14/15 1333   04/14/15 0600  vancomycin (VANCOCIN) IVPB 1000 mg/200 mL premix     1,000 mg 200 mL/hr over 60 Minutes Intravenous To Neuro OR-Station #32 04/13/15 1602 04/14/15 1145   04/13/15 1100  ceFEPIme (MAXIPIME) 2 g in dextrose 5 % 50 mL IVPB  Status:  Discontinued     2 g 100 mL/hr over 30 Minutes Intravenous Every 24 hours 04/13/15 1017 04/20/15 1124   04/12/15 0445  ceFAZolin (ANCEF) IVPB 1 g/50 mL premix     1 g 100 mL/hr over 30 Minutes Intravenous  Once 04/12/15 0430 04/12/15 0535      Best Practice/Protocols:  VTE Prophylaxis: Lovenox (prophylaxtic dose) and Mechanical GI Prophylaxis: Proton Pump Inhibitor Continous Sedation still on pressors of Vasopressing and Levo  Consults:      Events:  Subjective:    Overnight Issues: Atrial fibrillation controlled with cardiazem  Objective:  Vital signs for last 24 hours: Temp:  [97.7 F (36.5 C)-98.7 F (37.1 C)] 97.8 F (36.6 C) (02/03 0800) Pulse Rate:  [35-138] 47 (02/03 0800) Resp:  [8-37] 27 (02/03 0800) BP: (100-144)/(55-118) 111/76 mmHg (02/03  0800) SpO2:  [96 %-100 %] 100 % (02/03 0800) Arterial Line BP: (60-152)/(52-79) 126/65 mmHg (02/03 0800) FiO2 (%):  [40 %] 40 % (02/03 0744) Weight:  [91.5 kg (201 lb 11.5 oz)] 91.5 kg (201 lb 11.5 oz) (02/03 0500)  Hemodynamic parameters for last 24 hours: CVP:  [8 mmHg-16 mmHg] 11 mmHg  Intake/Output from previous day: 02/02 0701 - 02/03 0700 In: 5034.9 [I.V.:4284.9; NG/GT:400; IV Piggyback:350] Out: 1655 [Urine:1405; Drains:250]  Intake/Output this shift: Total I/O In: 217.1 [I.V.:167.1; IV Piggyback:50] Out: -   Vent settings for last 24 hours: Vent Mode:  [-] PRVC FiO2 (%):  [40 %] 40 % Set Rate:  [30 bmp] 30 bmp Vt Set:  [590 mL] 590 mL PEEP:  [10 cmH20] 10 cmH20 Plateau Pressure:  [17 cmH20-23 cmH20] 23 cmH20  Physical Exam:  General: no respiratory distress Neuro: RASS -3 or deeper Resp: clear to auscultation bilaterally CVS: IRR and atrial fibrillation GI: soft, nontender, BS WNL, no r/g Extremities: no edema, no erythema, pulses WNL  Results for orders placed or performed during the hospital encounter of 04/12/15 (from the past 24 hour(s))  Basic metabolic panel     Status: Abnormal  Collection Time: 05/14/15  9:45 AM  Result Value Ref Range   Sodium 143 135 - 145 mmol/L   Potassium 5.2 (H) 3.5 - 5.1 mmol/L   Chloride 113 (H) 101 - 111 mmol/L   CO2 16 (L) 22 - 32 mmol/L   Glucose, Bld 137 (H) 65 - 99 mg/dL   BUN 48 (H) 6 - 20 mg/dL   Creatinine, Ser 4.09 (H) 0.61 - 1.24 mg/dL   Calcium 8.4 (L) 8.9 - 10.3 mg/dL   GFR calc non Af Amer 42 (L) >60 mL/min   GFR calc Af Amer 48 (L) >60 mL/min   Anion gap 14 5 - 15  Glucose, capillary     Status: Abnormal   Collection Time: 05/14/15 12:25 PM  Result Value Ref Range   Glucose-Capillary 137 (H) 65 - 99 mg/dL   Comment 1 Document in Chart   Glucose, capillary     Status: Abnormal   Collection Time: 05/14/15  3:40 PM  Result Value Ref Range   Glucose-Capillary 131 (H) 65 - 99 mg/dL  Glucose, capillary      Status: Abnormal   Collection Time: 05/14/15  7:55 PM  Result Value Ref Range   Glucose-Capillary 157 (H) 65 - 99 mg/dL   Comment 1 Notify RN    Comment 2 Document in Chart   Glucose, capillary     Status: Abnormal   Collection Time: 05/14/15 11:54 PM  Result Value Ref Range   Glucose-Capillary 137 (H) 65 - 99 mg/dL  Glucose, capillary     Status: Abnormal   Collection Time: 05/15/15  3:51 AM  Result Value Ref Range   Glucose-Capillary 135 (H) 65 - 99 mg/dL   Comment 1 Notify RN    Comment 2 Document in Chart   CBC with Differential/Platelet     Status: Abnormal   Collection Time: 05/15/15  5:30 AM  Result Value Ref Range   WBC 29.1 (H) 4.0 - 10.5 K/uL   RBC 3.13 (L) 4.22 - 5.81 MIL/uL   Hemoglobin 9.1 (L) 13.0 - 17.0 g/dL   HCT 81.1 (L) 91.4 - 78.2 %   MCV 93.0 78.0 - 100.0 fL   MCH 29.1 26.0 - 34.0 pg   MCHC 31.3 30.0 - 36.0 g/dL   RDW 95.6 (H) 21.3 - 08.6 %   Platelets 170 150 - 400 K/uL   Neutrophils Relative % 93 %   Lymphocytes Relative 4 %   Monocytes Relative 3 %   Eosinophils Relative 0 %   Basophils Relative 0 %   Neutro Abs 27.0 (H) 1.7 - 7.7 K/uL   Lymphs Abs 1.2 0.7 - 4.0 K/uL   Monocytes Absolute 0.9 0.1 - 1.0 K/uL   Eosinophils Absolute 0.0 0.0 - 0.7 K/uL   Basophils Absolute 0.0 0.0 - 0.1 K/uL  Magnesium     Status: None   Collection Time: 05/15/15  5:30 AM  Result Value Ref Range   Magnesium 2.4 1.7 - 2.4 mg/dL  Phosphorus     Status: None   Collection Time: 05/15/15  5:30 AM  Result Value Ref Range   Phosphorus 2.7 2.5 - 4.6 mg/dL  Basic metabolic panel     Status: Abnormal   Collection Time: 05/15/15  5:30 AM  Result Value Ref Range   Sodium 140 135 - 145 mmol/L   Potassium 4.4 3.5 - 5.1 mmol/L   Chloride 113 (H) 101 - 111 mmol/L   CO2 17 (L) 22 - 32 mmol/L   Glucose,  Bld 157 (H) 65 - 99 mg/dL   BUN 50 (H) 6 - 20 mg/dL   Creatinine, Ser 1.61 0.61 - 1.24 mg/dL   Calcium 8.5 (L) 8.9 - 10.3 mg/dL   GFR calc non Af Amer 56 (L) >60 mL/min   GFR  calc Af Amer >60 >60 mL/min   Anion gap 10 5 - 15  Glucose, capillary     Status: Abnormal   Collection Time: 05/15/15  8:01 AM  Result Value Ref Range   Glucose-Capillary 138 (H) 65 - 99 mg/dL   Comment 1 Notify RN    Comment 2 Document in Chart      Assessment/Plan:   NEURO  Altered Mental Status:  sedation   Plan: Wean sedation and get a true wake up assessment  PULM  Atelectasis/collapse (focal and RLL infiltrate)   Plan: Continue empiric antibiotics.  Check cultures.  CARDIO  Atrial Fibrillation (with controlled ventricular response)   Plan: Continue cardiazem  RENAL  Marginal urine output, but adequat.  BUN up to 50, creatinine 1.09.   Plan: Look at overall volume status  GI  No acute issues   Plan: Restart tube feedings today.  ID  Pneumonia (hospital acquired (not ventilator-associated) Likley GP pneeumonia.  cultures are pending.)   Plan: Continue empiric antibiotics  HEME  Anemia acute blood loss anemia and anemia of critical illness)   Plan: No blood for now.  Also, WBC 29.1K.  ENDO No known issues.   Plan: CPM  Global Issues  The patient is still on pressors, but just stopped the vasopressin.    Will try to get off all pressors. Will also wean on the ventilator but not plan on extubating.  Restart tube feedings.  Probably will not be able to perform the tracheostomy until next week.    LOS: 33 days   Additional comments:I reviewed the patient's new clinical lab test results. cbc/bmet and I reviewed the patients new imaging test results. cxr  Critical Care Total Time*: 30 Minutes  Jazmon Kos 05/15/2015  *Care during the described time interval was provided by me and/or other providers on the critical care team.  I have reviewed this patient's available data, including medical history, events of note, physical examination and test results as part of my evaluation.

## 2015-05-15 NOTE — Progress Notes (Signed)
Patient now in new onset a.fib HR 110s-120s. Notified Dr Janee Morn, order received for  IV lopressor x1. Will continue to monitor.

## 2015-05-15 NOTE — Progress Notes (Signed)
PT Cancellation Note  Patient Details Name: [name] MRN: [id number] DOB: 10-22-1933   Cancelled Treatment:    Reason Eval/Treat Not Completed: Patient not medically ready.  Pt not appropriate for PT and mobility at this time.  Will sign off and need re-order once appropriate.     Lang Zingg, Alison Murray 05/15/2015, 10:36 AM

## 2015-05-15 NOTE — Care Management Important Message (Signed)
Important Message  Patient Details  Name: [name] MRN: [id number] Date of Birth: 1933-08-28   Medicare Important Message Given:  Yes    Maleta Pacha Abena 05/15/2015, 11:33 AM

## 2015-05-15 NOTE — Progress Notes (Signed)
Pharmacy Antibiotic Note  [name] is a 82 y.o. male admitted on 04/12/2015 with pneumonia.  Pharmacy has been consulted for vancomycin and ceftazidime dosing. Today is day #3 of therapy. Pt is afebrile and WBC is elevated at 29.1. Scr has improved significantly. PCT is very elevated at 119.21. Blood culture is positive for coag negative staph in 1/2 cultures. This is likely a contaminant.   Plan: - Change vancomycin to  IV Q12H - MD consider DC - Change ceftaz to 1gm IV Q8H - F/u renal fxn, C&S, clinical status and trough at SS  Height:  (177.8 cm) Weight: 201 lb 11.5 oz (91.5 kg) IBW/kg (Calculated) : 73  Temp (24hrs), Avg:98.2 F (36.8 C), Min:97.7 F (36.5 C), Max:98.7 F (37.1 C)   Recent Labs Lab 05/09/15 1130 05/10/15 0420 05/12/15 2015 05/12/15 2309 05/13/15 0630 05/14/15 0400 05/14/15 0945 05/15/15 0530  WBC  --   --  3.8*  --  8.9 27.8*  --  29.1*  CREATININE 1.17 1.07 1.80*  --   --   --  1.53* 1.19  LATICACIDVEN  --   --   --  2.7*  --   --   --   --     Estimated Creatinine Clearance: 57.2 mL/min (by C-G formula based on Cr of 1.19).    Allergies  Allergen Reactions  . Bee Venom Anaphylaxis    Allergic reaction-anaphylaxis? Has epipen  . Morphine And Related Anxiety    Family requested Morphine be not given because of pt having extreme agitation and anxiety.  [name] [Levofloxacin In D5w] Rash  . Levofloxacin Rash  . Lipitor [Atorvastatin] Other (See Comments)  . Penicillins Hives    Tolerates cefepime  . Zithromax [Azithromycin] Other (See Comments)    Antimicrobials this admission: 1/5 Cefepime >> 1/10; 1/12>>1/21  1/3 Vanc >> 1/4; 1/12>>1/16 2/1>> Ceftazidime 2/1>>  Dose adjustments this admission: 2/3 Vancomycin changed to  Q12H and ceftazidime to 1gm Q8H due to improved renal function  Microbiology results: 1/1 MRSA PCR neg 1/9 trach aspirate- normal flora final 1/12- trach aspirate - normal flora final 1/12  BAL - normal flora final 2/1 TA - pending 1/31 Blood - CNS in 1 of 2 BC 2/1 Urine - NEG  Thank you for allowing pharmacy to be a part of this patient's care.  [name], Drake Leach 05/15/2015 7:42 AM

## 2015-05-16 LAB — CULTURE, RESPIRATORY

## 2015-05-16 LAB — CBC WITH DIFFERENTIAL/PLATELET
BASOS ABS: 0 10*3/uL (ref 0.0–0.1)
Basophils Relative: 0 %
Eosinophils Absolute: 0 10*3/uL (ref 0.0–0.7)
Eosinophils Relative: 0 %
HCT: 29.7 % — ABNORMAL LOW (ref 39.0–52.0)
HEMOGLOBIN: 9.3 g/dL — AB (ref 13.0–17.0)
LYMPHS ABS: 1 10*3/uL (ref 0.7–4.0)
LYMPHS PCT: 3 %
MCH: 29.2 pg (ref 26.0–34.0)
MCHC: 31.3 g/dL (ref 30.0–36.0)
MCV: 93.4 fL (ref 78.0–100.0)
MONOS PCT: 3 %
Monocytes Absolute: 1 10*3/uL (ref 0.1–1.0)
NEUTROS PCT: 94 %
Neutro Abs: 31.7 10*3/uL — ABNORMAL HIGH (ref 1.7–7.7)
Platelets: 146 10*3/uL — ABNORMAL LOW (ref 150–400)
RBC: 3.18 MIL/uL — AB (ref 4.22–5.81)
RDW: 15.9 % — ABNORMAL HIGH (ref 11.5–15.5)
WBC Morphology: INCREASED
WBC: 33.7 10*3/uL — AB (ref 4.0–10.5)

## 2015-05-16 LAB — GLUCOSE, CAPILLARY
GLUCOSE-CAPILLARY: 161 mg/dL — AB (ref 65–99)
Glucose-Capillary: 142 mg/dL — ABNORMAL HIGH (ref 65–99)
Glucose-Capillary: 151 mg/dL — ABNORMAL HIGH (ref 65–99)
Glucose-Capillary: 158 mg/dL — ABNORMAL HIGH (ref 65–99)
Glucose-Capillary: 160 mg/dL — ABNORMAL HIGH (ref 65–99)
Glucose-Capillary: 164 mg/dL — ABNORMAL HIGH (ref 65–99)
Glucose-Capillary: 179 mg/dL — ABNORMAL HIGH (ref 65–99)

## 2015-05-16 LAB — PHOSPHORUS: PHOSPHORUS: 3.4 mg/dL (ref 2.5–4.6)

## 2015-05-16 LAB — CULTURE, RESPIRATORY W GRAM STAIN

## 2015-05-16 LAB — MAGNESIUM: MAGNESIUM: 2.3 mg/dL (ref 1.7–2.4)
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
## 2015-05-16 NOTE — Progress Notes (Signed)
PEEP titrated pt tolerated well.

## 2015-05-16 NOTE — Progress Notes (Signed)
30 ml of Versed gtt wasted and witnessed by [employer], RN. Drip no longer running and bag expired.

## 2015-05-17 ENCOUNTER — Inpatient Hospital Stay (HOSPITAL_COMMUNITY): Payer: No Typology Code available for payment source

## 2015-05-17 LAB — BASIC METABOLIC PANEL
Anion gap: 7 (ref 5–15)
BUN: 47 mg/dL — AB (ref 6–20)
CALCIUM: 8.4 mg/dL — AB (ref 8.9–10.3)
CO2: 20 mmol/L — ABNORMAL LOW (ref 22–32)
CREATININE: 1.07 mg/dL (ref 0.61–1.24)
Chloride: 115 mmol/L — ABNORMAL HIGH (ref 101–111)
GFR calc Af Amer: >60 mL/min (ref >60)
Glucose, Bld: 163 mg/dL — ABNORMAL HIGH (ref 65–99)
Potassium: 4.1 mmol/L (ref 3.5–5.1)
SODIUM: 142 mmol/L (ref 135–145)

## 2015-05-17 LAB — CBC
HCT: 27.9 % — ABNORMAL LOW (ref 39.0–52.0)
Hemoglobin: 8.7 g/dL — ABNORMAL LOW (ref 13.0–17.0)
MCH: 29 pg (ref 26.0–34.0)
MCHC: 31.2 g/dL (ref 30.0–36.0)
MCV: 93 fL (ref 78.0–100.0)
PLATELETS: 122 10*3/uL — AB (ref 150–400)
RBC: 3 MIL/uL — ABNORMAL LOW (ref 4.22–5.81)
RDW: 16.1 % — ABNORMAL HIGH (ref 11.5–15.5)
WBC: 20.4 10*3/uL — ABNORMAL HIGH (ref 4.0–10.5)

## 2015-05-17 LAB — GLUCOSE, CAPILLARY
GLUCOSE-CAPILLARY: 158 mg/dL — AB (ref 65–99)
Glucose-Capillary: 136 mg/dL — ABNORMAL HIGH (ref 65–99)
Glucose-Capillary: 144 mg/dL — ABNORMAL HIGH (ref 65–99)
Glucose-Capillary: 148 mg/dL — ABNORMAL HIGH (ref 65–99)
Glucose-Capillary: 161 mg/dL — ABNORMAL HIGH (ref 65–99)
Glucose-Capillary: 176 mg/dL — ABNORMAL HIGH (ref 65–99)

## 2015-05-17 MED ORDER — CLINDAMYCIN PALMITATE HCL 75 MG/5ML PO SOLR
300.0000 mg | Freq: Four times a day (QID) | ORAL | Status: AC
  Administered 2015-05-17 – 2015-05-20 (×12): 300 mg
  Filled 2015-05-17 (×12): qty 20

## 2015-05-17 MED ORDER — HYDROCORTISONE NA SUCCINATE PF 100 MG IJ SOLR
50.0000 mg | Freq: Three times a day (TID) | INTRAMUSCULAR | Status: DC
  Administered 2015-05-17 – 2015-05-20 (×10): 50 mg via INTRAVENOUS
  Filled 2015-05-17 (×10): qty 2
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
## 2015-05-18 ENCOUNTER — Inpatient Hospital Stay (HOSPITAL_COMMUNITY): Payer: No Typology Code available for payment source

## 2015-05-18 ENCOUNTER — Inpatient Hospital Stay (HOSPITAL_COMMUNITY): Payer: No Typology Code available for payment source | Admitting: Anesthesiology

## 2015-05-18 ENCOUNTER — Encounter (HOSPITAL_COMMUNITY)
Admission: EM | Disposition: A | Discharge status: Nursing Home (Includes ICF) | Payer: Self-pay | Source: Home / Self Care

## 2015-05-18 DIAGNOSIS — M7989 Other specified soft tissue disorders: Secondary | ICD-10-CM

## 2015-05-18 HISTORY — PX: TRACHEOSTOMY TUBE PLACEMENT: SHX814

## 2015-05-18 LAB — GLUCOSE, CAPILLARY
GLUCOSE-CAPILLARY: 165 mg/dL — AB (ref 65–99)
GLUCOSE-CAPILLARY: 144 mg/dL — AB (ref 65–99)
GLUCOSE-CAPILLARY: 124 mg/dL — AB (ref 65–99)
Glucose-Capillary: 146 mg/dL — ABNORMAL HIGH (ref 65–99)
Glucose-Capillary: 146 mg/dL — ABNORMAL HIGH (ref 65–99)
Glucose-Capillary: 134 mg/dL — ABNORMAL HIGH (ref 65–99)

## 2015-05-18 LAB — CULTURE, BLOOD (ROUTINE X 2): CULTURE: NO GROWTH

## 2015-05-18 SURGERY — CREATION, TRACHEOSTOMY
Anesthesia: General | Site: Neck

## 2015-05-18 MED ORDER — FENTANYL CITRATE (PF) 250 MCG/5ML IJ SOLN
INTRAMUSCULAR | Status: AC
  Filled 2015-05-18: qty 5

## 2015-05-18 MED ORDER — VECURONIUM BROMIDE 10 MG IV SOLR
INTRAVENOUS | Status: DC | PRN
  Administered 2015-05-18: 10 mg via INTRAVENOUS

## 2015-05-18 MED ORDER — LACTATED RINGERS IV SOLN
INTRAVENOUS | Status: DC | PRN
  Administered 2015-05-18: 12:00:00 via INTRAVENOUS

## 2015-05-18 MED ORDER — 0.9 % SODIUM CHLORIDE (POUR BTL) OPTIME
TOPICAL | Status: DC | PRN
  Administered 2015-05-18: 1000 mL

## 2015-05-18 MED ORDER — PHENYLEPHRINE HCL 10 MG/ML IJ SOLN
INTRAMUSCULAR | Status: DC | PRN
  Administered 2015-05-18 (×3): 80 ug via INTRAVENOUS

## 2015-05-18 MED ORDER — DILTIAZEM 12 MG/ML ORAL SUSPENSION
30.0000 mg | Freq: Four times a day (QID) | ORAL | Status: DC
  Administered 2015-05-18 – 2015-05-20 (×9): 30 mg
  Filled 2015-05-18 (×12): qty 3

## 2015-05-18 MED ORDER — EPHEDRINE SULFATE 50 MG/ML IJ SOLN
INTRAMUSCULAR | Status: DC | PRN
  Administered 2015-05-18: 10 mg via INTRAVENOUS
  Administered 2015-05-18: 15 mg via INTRAVENOUS

## 2015-05-18 MED ORDER — FENTANYL CITRATE (PF) 100 MCG/2ML IJ SOLN
INTRAMUSCULAR | Status: DC | PRN
  Administered 2015-05-18: 50 ug via INTRAVENOUS
  Administered 2015-05-18 (×2): 100 ug via INTRAVENOUS

## 2015-05-18 SURGICAL SUPPLY — 42 items
ATTRACTOMAT 16X20 MAGNETIC DRP (DRAPES) ×3 IMPLANT
BLADE SURG ROTATE 9660 (MISCELLANEOUS) IMPLANT
CANISTER SUCTION 2500CC (MISCELLANEOUS) ×3 IMPLANT
CHLORAPREP W/TINT 26ML (MISCELLANEOUS) ×3 IMPLANT
CLEANER TIP ELECTROSURG 2X2 (MISCELLANEOUS) IMPLANT
COVER SURGICAL LIGHT HANDLE (MISCELLANEOUS) ×3 IMPLANT
DRAPE UTILITY XL STRL (DRAPES) ×3 IMPLANT
ELECT CAUTERY BLADE 6.4 (BLADE) ×3 IMPLANT
ELECT REM PT RETURN 9FT ADLT (ELECTROSURGICAL) ×3
ELECTRODE REM PT RTRN 9FT ADLT (ELECTROSURGICAL) ×1 IMPLANT
GAUZE SPONGE 4X4 16PLY XRAY LF (GAUZE/BANDAGES/DRESSINGS) ×3 IMPLANT
GEL ULTRASOUND 20GR AQUASONIC (MISCELLANEOUS) ×3 IMPLANT
GLOVE BIO SURGEON STRL SZ 6 (GLOVE) ×3 IMPLANT
GLOVE BIOGEL PI IND STRL 6.5 (GLOVE) ×2 IMPLANT
GLOVE BIOGEL PI IND STRL 8 (GLOVE) ×1 IMPLANT
GLOVE BIOGEL PI INDICATOR 6.5 (GLOVE) ×4
GLOVE BIOGEL PI INDICATOR 8 (GLOVE) ×2
GLOVE ECLIPSE 6.5 STRL STRAW (GLOVE) ×3 IMPLANT
GLOVE ECLIPSE 7.5 STRL STRAW (GLOVE) ×3 IMPLANT
GOWN STRL REUS W/ TWL LRG LVL3 (GOWN DISPOSABLE) ×2 IMPLANT
GOWN STRL REUS W/TWL LRG LVL3 (GOWN DISPOSABLE) ×4
HOLDER TRACH TUBE VELCRO 19.5 (MISCELLANEOUS) ×3 IMPLANT
KIT BASIN OR (CUSTOM PROCEDURE TRAY) ×3 IMPLANT
KIT ROOM TURNOVER OR (KITS) ×3 IMPLANT
NS IRRIG 1000ML POUR BTL (IV SOLUTION) ×3 IMPLANT
PACK EENT II TURBAN DRAPE (CUSTOM PROCEDURE TRAY) ×3 IMPLANT
PAD ARMBOARD 7.5X6 YLW CONV (MISCELLANEOUS) ×6 IMPLANT
PENCIL BUTTON HOLSTER BLD 10FT (ELECTRODE) ×3 IMPLANT
SPONGE DRAIN TRACH 4X4 STRL 2S (GAUZE/BANDAGES/DRESSINGS) ×3 IMPLANT
SPONGE INTESTINAL PEANUT (DISPOSABLE) ×3 IMPLANT
SUT ETHILON 3 0 FSL (SUTURE) ×3 IMPLANT
SUT SILK 2 0 SH (SUTURE) ×3 IMPLANT
SUT VICRYL AB 3 0 TIES (SUTURE) ×3 IMPLANT
SYR 20CC LL (SYRINGE) ×3 IMPLANT
TOWEL OR 17X24 6PK STRL BLUE (TOWEL DISPOSABLE) IMPLANT
TOWEL OR 17X26 10 PK STRL BLUE (TOWEL DISPOSABLE) ×3 IMPLANT
TUBE CONNECTING 12'X1/4 (SUCTIONS) ×1
TUBE CONNECTING 12X1/4 (SUCTIONS) ×2 IMPLANT
TUBE TRACH SHILEY  6 DIST  CUF (TUBING) IMPLANT
TUBE TRACH SHILEY 10 DIST CUFF (TUBING) IMPLANT
TUBE TRACH SHILEY 8 DIST CUF (TUBING) ×3 IMPLANT
WATER STERILE IRR 1000ML POUR (IV SOLUTION) IMPLANT

## 2015-05-18 NOTE — Anesthesia Preprocedure Evaluation (Signed)
Anesthesia Evaluation  Patient identified by MRN, date of birth, ID band Patient confused    Reviewed: Allergy & Precautions, H&P , NPO status , Patient's Chart, lab work & pertinent test results  History of Anesthesia Complications Negative for: history of anesthetic complications  Airway Mallampati: Intubated      Comment: Patient is in C collar Dental  (+) Edentulous Lower, Edentulous Upper   Pulmonary former smoker,   No signs of pulmonary rales on exam   (-) decreased breath sounds  + intubated    Cardiovascular + CAD, + CABG and + Peripheral Vascular Disease  Normal cardiovascular exam Rhythm:Irregular Rate:Tachycardia  Echo done (1/2) shows EF of 55-60%, technically difficult study, AV showing moderate to severe stenosis, mean gradient is , valve area shoots to around 1cm     Neuro/Psych  Neuromuscular disease    GI/Hepatic negative GI ROS, Neg liver ROS,   Endo/Other  negative endocrine ROS  Renal/GU      Musculoskeletal   Abdominal   Peds  Hematology negative hematology ROS (+)   Anesthesia Other Findings afib with RVR on dilt drip, respiratory failure and unable to wean from vent following aspiration and PEA event on 1/12.Marland Kitchen Here for Trach  Reproductive/Obstetrics negative OB ROS                             Anesthesia Physical  Anesthesia Plan  ASA: IV  Anesthesia Plan: General   Post-op Pain Management:    Induction: Intravenous  Airway Management Planned: Oral ETT  Additional Equipment:   Intra-op Plan:   Post-operative Plan: Post-operative intubation/ventilation  Informed Consent: I have reviewed the patients History and Physical, chart, labs and discussed the procedure including the risks, benefits and alternatives for the proposed anesthesia with the patient or authorized representative who has indicated his/her understanding and acceptance.   Dental  Advisory Given  Plan Discussed with: Anesthesiologist, CRNA and Surgeon  Anesthesia Plan Comments: (GA with fentanyl, volatile, paralysis to assist with trach placement Hgb is 8.7)        Anesthesia Quick Evaluation

## 2015-05-18 NOTE — Op Note (Signed)
OPERATIVE REPORT  DATE OF OPERATION:  05/18/2015  PATIENT:  [name]  83 y.o. male  PRE-OPERATIVE DIAGNOSIS:  Respiratory failure  POST-OPERATIVE DIAGNOSIS:  Respiratory failure  PROCEDURE:  Procedure(s): TRACHEOSTOMY  SURGEON:  Surgeon(s): Jimmye Norman, MD  ASSISTANT: Simaan, PA-S  ANESTHESIA:   general  EBL: <20 ml  BLOOD ADMINISTERED: none  DRAINS: Gastrostomy Tube and #[id number] tracheostomy tube   SPECIMEN:  No Specimen  COUNTS CORRECT:  YES  PROCEDURE DETAILS: The patient was taken to the operating room and placed on the table in the supine position. After an adequate general endotracheal anesthetic was administered he was prepped and draped in usual sterile manner exposing his neck.  A proper timeout was performed identifying the patient and procedure to be performed. A transverse incision was made approximately 1 cm above the sternal notch and taken down into the subcutaneous tissue. The anterior jugular veins bilaterally were noted and were not ligated at this stage. We went in between them and also dissected out the thyroid gland which allowed to expose the first, second and third tracheal rings. Once these were identified and we were down to the pretracheal fascia a focus placed in the trachea lifted up as we placed 2 looped stay sutures of 2-0 silk around the second tracheal ring. An inferior tracheotomy was made using #11 blade and then enlarged with a hemostat clamp and a tracheal dilator. We could see the endotracheal tube pulled back as we placed the tracheal spreader open as it was withdrawn back just proximal to the tracheotomy. We then passed a lubricated number 8 tracheostomy tube into the tracheotomy. There was excellent carbon dioxide return and tidal volume return.  We secured the tracheostomy tube in place using 3-0 nylon sutures and a tracheal tie. A drain sponge was placed underneath the flanges of the tracheostomy to all counts were correct including  needles, sponges, and instruments.  PATIENT DISPOSITION:  Back to the ICU in stable condition with fresh tracheostomy tube with minimal difficulty.   [name] 2/6/20171:45 PM

## 2015-05-18 NOTE — Transfer of Care (Signed)
Immediate Anesthesia Transfer of Care Note  Patient: [name]  Procedure(s) Performed: Procedure(s): TRACHEOSTOMY (N/A)  Patient Location: PACU  Anesthesia Type:General  Level of Consciousness: sedated and Patient remains intubated per anesthesia plan  Airway & Oxygen Therapy: Patient remains intubated per anesthesia plan and Patient placed on Ventilator (see vital sign flow sheet for setting)  Post-op Assessment: Report given to RN and Post -op Vital signs reviewed and stable  Post vital signs: Reviewed and stable  Last Vitals:  Filed Vitals:   05/18/15 1118 05/18/15 1200  BP: 132/78 128/80  Pulse: 98 107  Temp:  36.8 C  Resp: 25 18    Complications: No apparent anesthesia complications

## 2015-05-18 NOTE — Progress Notes (Signed)
Follow up - Trauma and Critical Care  Patient Details:    [name] is an 82 y.o. male.  Lines/tubes : Airway 7.5 mm (Active)  Secured at (cm) 23 cm 05/18/2015  7:55 AM  Measured From Lips 05/18/2015  7:55 AM  Secured Location Right 05/18/2015  7:55 AM  Secured By [employer] 05/18/2015  7:55 AM  Tube Holder Repositioned Yes 05/18/2015  7:55 AM  Cuff Pressure (cm H2O) 26 cm H2O 05/18/2015  7:55 AM  Site Condition Dry 05/18/2015  7:55 AM     PICC Triple Lumen 04/19/15 PICC Right Basilic 37 cm 0 cm (Active)  Indication for Insertion or Continuance of Line Prolonged intravenous therapies;Limited venous access - need for IV therapy >5 days (PICC only) 05/18/2015  8:00 AM  Exposed Catheter (cm) 0 cm 05/07/2015  6:00 AM  Site Assessment Edematous 05/18/2015  8:00 AM  Lumen #1 Status Infusing 05/18/2015  8:00 AM  Lumen #2 Status Infusing 05/18/2015  8:00 AM  Lumen #3 Status Other (Comment) 05/18/2015  8:00 AM  Dressing Type Transparent;Occlusive 05/18/2015  8:00 AM  Dressing Status Clean;Dry;Intact;Antimicrobial disc in place 05/18/2015  8:00 AM  Line Care Connections checked and tightened 05/18/2015  8:00 AM  Line Adjustment (NICU/IV Team Only) No 04/19/2015 12:57 PM  Dressing Intervention New dressing;Antimicrobial disc changed;Dressing changed 05/14/2015  4:30 PM  Dressing Change Due 05/21/15 05/18/2015  8:00 AM     Gastrostomy/Enterostomy Percutaneous endoscopic gastrostomy (PEG) 24 Fr. LUQ (Active)  Surrounding Skin Intact 05/18/2015  8:00 AM  Tube Status Patent 05/18/2015  8:00 AM  Drainage Appearance Tan 05/16/2015  8:00 PM  Dressing Status Clean;Dry;Intact 05/18/2015  8:00 AM  Dressing Intervention Dressing changed 05/18/2015  5:00 AM  Dressing Type Split gauze;Abdominal Binder 05/18/2015  8:00 AM  G Port Intake (mL) 50 ml 05/18/2015 12:00 AM  J Port Intake (mL) 50 ml 05/18/2015  6:00 AM  Output (mL) 250 mL 05/15/2015  5:00 AM     Urethral Catheter [name] Straight-tip (Active)  Indication for Insertion or  Continuance of Catheter Unstable critical patients (first 24-48 hours) 05/18/2015  8:00 AM  Site Assessment Clean;Intact 05/18/2015  8:00 AM  Catheter Maintenance Bag below level of bladder;Catheter secured;Drainage bag/tubing not touching floor;No dependent loops;Seal intact 05/18/2015  8:00 AM  Collection Container Standard drainage bag 05/18/2015  8:00 AM  Securement Method Securing device (Describe) 05/18/2015  8:00 AM  Urinary Catheter Interventions Unclamped 05/18/2015  8:00 AM  Output (mL) 100 mL 05/18/2015  8:00 AM    Microbiology/Sepsis markers: Results for orders placed or performed during the hospital encounter of 04/12/15  MRSA PCR Screening     Status: None   Collection Time: 04/12/15  7:04 AM  Result Value Ref Range Status   MRSA by PCR NEGATIVE NEGATIVE Final    Comment:        The GeneXpert MRSA Assay (FDA approved for NASAL specimens only), is one component of a comprehensive MRSA colonization surveillance program. It is not intended to diagnose MRSA infection nor to guide or monitor treatment for MRSA infections.   Culture, respiratory (NON-Expectorated)     Status: None   Collection Time: 04/20/15  8:39 AM  Result Value Ref Range Status   Specimen Description TRACHEAL ASPIRATE  Final   Special Requests Normal  Final   Gram Stain   Final    ABUNDANT WBC PRESENT,BOTH PMN AND MONONUCLEAR NO SQUAMOUS EPITHELIAL CELLS SEEN NO ORGANISMS SEEN Performed at [facility]  Final    NORMAL OROPHARYNGEAL FLORA Performed at [facility]    Report Status 04/22/2015 FINAL  Final  Culture, respiratory (NON-Expectorated)     Status: None   Collection Time: 04/23/15  2:45 PM  Result Value Ref Range Status   Specimen Description TRACHEAL ASPIRATE  Final   Special Requests NONE  Final   Gram Stain   Final    MODERATE WBC PRESENT,BOTH PMN AND MONONUCLEAR FEW SQUAMOUS EPITHELIAL CELLS PRESENT MODERATE GRAM POSITIVE COCCI IN PAIRS IN CLUSTERS Performed at  [facility]    Culture   Final    NORMAL OROPHARYNGEAL FLORA Performed at [facility]    Report Status 04/26/2015 FINAL  Final  Culture, bal-quantitative     Status: None   Collection Time: 04/23/15  3:43 PM  Result Value Ref Range Status   Specimen Description BRONCHIAL ALVEOLAR LAVAGE  Final   Special Requests NONE  Final   Gram Stain   Final    FEW WBC PRESENT, PREDOMINANTLY MONONUCLEAR ABUNDANT SQUAMOUS EPITHELIAL CELLS PRESENT MODERATE GRAM POSITIVE COCCI IN PAIRS IN CLUSTERS Performed at [facility]   Final    >=100,000 COLONIES/ML Performed at [facility]    Culture   Final    NORMAL OROPHARYNGEAL FLORA Performed at [facility]    Report Status 04/26/2015 FINAL  Final  Culture, blood (Routine X 2) w Reflex to ID Panel     Status: None   Collection Time: 05/12/15 11:59 PM  Result Value Ref Range Status   Specimen Description BLOOD LEFT ARM  Final   Special Requests IN PEDIATRIC BOTTLE 0.5ML  Final   Culture  Setup Time   Final    GRAM POSITIVE COCCI IN CLUSTERS AEROBIC BOTTLE ONLY CRITICAL RESULT CALLED TO, READ BACK BY AND VERIFIED WITH: Silas Flood RN 1924 05/13/15 A BROWNING    Culture   Final    STAPHYLOCOCCUS SPECIES (COAGULASE NEGATIVE) THE SIGNIFICANCE OF ISOLATING THIS ORGANISM FROM A SINGLE SET OF BLOOD CULTURES WHEN MULTIPLE SETS ARE DRAWN IS UNCERTAIN. PLEASE NOTIFY THE MICROBIOLOGY DEPARTMENT WITHIN ONE WEEK IF SPECIATION AND SENSITIVITIES ARE REQUIRED.    Report Status 05/14/2015 FINAL  Final  Culture, blood (Routine X 2) w Reflex to ID Panel     Status: None (Preliminary result)   Collection Time: 05/13/15 12:08 AM  Result Value Ref Range Status   Specimen Description BLOOD LEFT ARM  Final   Special Requests IN PEDIATRIC BOTTLE  Final   Culture NO GROWTH 4 DAYS  Final   Report Status PENDING  Incomplete  Culture, Urine     Status: None   Collection Time: 05/13/15  3:30 AM  Result  Value Ref Range Status   Specimen Description URINE, CATHETERIZED  Final   Special Requests NONE  Final   Culture NO GROWTH 1 DAY  Final   Report Status 05/14/2015 FINAL  Final  Culture, respiratory (NON-Expectorated)     Status: None   Collection Time: 05/13/15  4:50 AM  Result Value Ref Range Status   Specimen Description TRACHEAL ASPIRATE  Final   Special Requests NONE  Final   Gram Stain   Final    ABUNDANT WBC PRESENT,BOTH PMN AND MONONUCLEAR NO SQUAMOUS EPITHELIAL CELLS SEEN ABUNDANT GRAM POSITIVE COCCI IN PAIRS IN CLUSTERS FEW GRAM NEGATIVE RODS Performed at [facility]    Culture   Final    ABUNDANT STAPHYLOCOCCUS AUREUS Note: RIFAMPIN AND GENTAMICIN SHOULD  NOT BE USED AS SINGLE DRUGS FOR TREATMENT OF STAPH INFECTIONS. Performed at [facility]    Report Status 05/16/2015 FINAL  Final   Organism ID, Bacteria STAPHYLOCOCCUS AUREUS  Final      Susceptibility   Staphylococcus aureus - MIC*    CLINDAMYCIN <=0.25 SENSITIVE Sensitive     ERYTHROMYCIN <=0.25 SENSITIVE Sensitive     GENTAMICIN <=0.5 SENSITIVE Sensitive     LEVOFLOXACIN 0.25 SENSITIVE Sensitive     OXACILLIN 0.5 SENSITIVE Sensitive     RIFAMPIN <=0.5 SENSITIVE Sensitive     TRIMETH/SULFA <=10 SENSITIVE Sensitive     VANCOMYCIN <=0.5 SENSITIVE Sensitive     TETRACYCLINE <=1 SENSITIVE Sensitive     MOXIFLOXACIN <=0.25 SENSITIVE Sensitive     * ABUNDANT STAPHYLOCOCCUS AUREUS    Anti-infectives:  Anti-infectives    Start     Dose/Rate Route Frequency Ordered Stop   05/17/15 1200  clindamycin (CLEOCIN) 75 MG/5ML solution 300 mg     300 mg Per Tube 4 times per day 05/17/15 1026 05/14/2015 1159   05/15/15 1200  vancomycin (VANCOCIN) IVPB 750 mg/150 ml premix  Status:  Discontinued     750 mg 150 mL/hr over 60 Minutes Intravenous Every 12 hours 05/15/15 0742 05/17/15 1026   05/15/15 0900  cefTAZidime (FORTAZ) 1 g in dextrose 5 % 50 mL IVPB  Status:  Discontinued     1 g 100 mL/hr over 30 Minutes  Intravenous Every 8 hours 05/15/15 0746 05/16/15 1001   05/12/15 2300  vancomycin (VANCOCIN) 1,250 mg in sodium chloride 0.9 % 250 mL IVPB  Status:  Discontinued     1,250 mg 166.7 mL/hr over 90 Minutes Intravenous Every 24 hours 05/12/15 2244 05/15/15 0742   05/12/15 2300  cefTAZidime (FORTAZ) 1 g in dextrose 5 % 50 mL IVPB  Status:  Discontinued     1 g 100 mL/hr over 30 Minutes Intravenous Every 12 hours 05/12/15 2246 05/15/15 0746   05/12/15 2245  ceFEPIme (MAXIPIME) 2 g in dextrose 5 % 50 mL IVPB  Status:  Discontinued     2 g 100 mL/hr over 30 Minutes Intravenous Every 12 hours 05/12/15 2243 05/12/15 2245   05/01/15 0416  ceFEPIme (MAXIPIME) 2 g in dextrose 5 % 50 mL IVPB     2 g 100 mL/hr over 30 Minutes Intravenous Every 24 hours 04/30/15 1352 05/02/15 0507   04/27/15 0400  ceFEPIme (MAXIPIME) 2 g in dextrose 5 % 50 mL IVPB  Status:  Discontinued     2 g 100 mL/hr over 30 Minutes Intravenous Every 24 hours 04/26/15 0814 04/26/15 0833   04/26/15 2000  vancomycin (VANCOCIN) IVPB 750 mg/150 ml premix  Status:  Discontinued     750 mg 150 mL/hr over 60 Minutes Intravenous Every 12 hours 04/26/15 0817 04/27/15 0900   04/26/15 1600  ceFEPIme (MAXIPIME) 2 g in dextrose 5 % 50 mL IVPB  Status:  Discontinued     2 g 100 mL/hr over 30 Minutes Intravenous Every 12 hours 04/26/15 0833 04/30/15 1352   04/24/15 2000  vancomycin (VANCOCIN) 1,250 mg in sodium chloride 0.9 % 250 mL IVPB  Status:  Discontinued     1,250 mg 166.7 mL/hr over 90 Minutes Intravenous Every 12 hours 04/24/15 0805 04/26/15 0817   04/24/15 1300  ceFEPIme (MAXIPIME) 2 g in dextrose 5 % 50 mL IVPB  Status:  Discontinued     2 g 100 mL/hr over 30 Minutes Intravenous Every 8 hours 04/24/15 0813 04/26/15 1610  04/23/15 1600  vancomycin (VANCOCIN) IVPB 750 mg/150 ml premix  Status:  Discontinued     750 mg 150 mL/hr over 60 Minutes Intravenous Every 12 hours 04/23/15 1513 04/24/15 0805   04/23/15 1530  ceFEPIme (MAXIPIME) 2  g in dextrose 5 % 50 mL IVPB  Status:  Discontinued     2 g 100 mL/hr over 30 Minutes Intravenous Every 12 hours 04/23/15 1504 04/24/15 0813   04/20/15 2200  ceFEPIme (MAXIPIME) 2 g in dextrose 5 % 50 mL IVPB  Status:  Discontinued     2 g 100 mL/hr over 30 Minutes Intravenous Every 12 hours 04/20/15 1124 04/22/15 0847   04/14/15 2230  vancomycin (VANCOCIN) IVPB 750 mg/150 ml premix  Status:  Discontinued     750 mg 150 mL/hr over 60 Minutes Intravenous Every 12 hours 04/14/15 1508 04/15/15 1130   04/14/15 1338  vancomycin (VANCOCIN) 1000 MG powder  Status:  Discontinued    Comments:  Ratcliff, Esther   : cabinet override      04/14/15 1338 04/14/15 1343   04/14/15 1213  vancomycin (VANCOCIN) powder  Status:  Discontinued       As needed 04/14/15 1214 04/14/15 1333   04/14/15 1211  vancomycin (VANCOCIN) 1000 MG powder    Comments:  Ratcliff, Esther   : cabinet override      04/14/15 1211 04/15/15 0014   04/14/15 1147  bacitracin 50,000 Units in sodium chloride irrigation 0.9 % 500 mL irrigation  Status:  Discontinued       As needed 04/14/15 1147 04/14/15 1333   04/14/15 0600  vancomycin (VANCOCIN) IVPB 1000 mg/200 mL premix     1,000 mg 200 mL/hr over 60 Minutes Intravenous To Neuro OR-Station #32 04/13/15 1602 04/14/15 1145   04/13/15 1100  ceFEPIme (MAXIPIME) 2 g in dextrose 5 % 50 mL IVPB  Status:  Discontinued     2 g 100 mL/hr over 30 Minutes Intravenous Every 24 hours 04/13/15 1017 04/20/15 1124   04/12/15 0445  ceFAZolin (ANCEF) IVPB 1 g/50 mL premix     1 g 100 mL/hr over 30 Minutes Intravenous  Once 04/12/15 0430 04/12/15 0535      Best Practice/Protocols:  VTE Prophylaxis: Lovenox (prophylaxtic dose) and Mechanical GI Prophylaxis: Proton Pump Inhibitor Continous Sedation  Consults:      Events:  Subjective:    Overnight Issues: Patient is off all pressors and only on fentanyl sedation.  Objective:  Vital signs for last 24 hours: Temp:  [97.4 F (36.3  C)-98.3 F (36.8 C)] 98 F (36.7 C) (02/06 0400) Pulse Rate:  [45-128] 128 (02/06 0800) Resp:  [12-31] 21 (02/06 0800) BP: (83-142)/(57-105) 142/105 mmHg (02/06 0800) SpO2:  [94 %-100 %] 99 % (02/06 0800) Arterial Line BP: (92-150)/(50-76) 135/68 mmHg (02/05 1500) FiO2 (%):  [30 %] 30 % (02/06 0800) Weight:  [101.2 kg (223 lb 1.7 oz)] 101.2 kg (223 lb 1.7 oz) (02/06 0500)  Hemodynamic parameters for last 24 hours: CVP:  [6 mmHg-20 mmHg] 12 mmHg  Intake/Output from previous day: 02/05 0701 - 02/06 0700 In: 5200 [I.V.:3450; NG/GT:1550] Out: 1480 [Urine:1480]  Intake/Output this shift: Total I/O In: 142.5 [I.V.:142.5] Out: 100 [Urine:100]  Vent settings for last 24 hours: Vent Mode:  [-] CPAP FiO2 (%):  [30 %] 30 % Set Rate:  [20 bmp] 20 bmp Vt Set:  [590 mL] 590 mL PEEP:  [5 cmH20] 5 cmH20 Pressure Support:  [8 cmH20] 8 cmH20 Plateau Pressure:  [13 cmH20-18 cmH20] 13  cmH20  Physical Exam:  General: no respiratory distress and agitated Neuro: agitated Resp: clear to auscultation bilaterally CVS: Afib with RVR in 100's GI: soft, nontender, BS WNL, no r/g and had been tolerating tube feedings well until stopped for surgery today Extremities: edema 1+  Results for orders placed or performed during the hospital encounter of 04/12/15 (from the past 24 hour(s))  Glucose, capillary     Status: Abnormal   Collection Time: 05/17/15 11:42 AM  Result Value Ref Range   Glucose-Capillary 161 (Marks) 65 - 99 mg/dL  Glucose, capillary     Status: Abnormal   Collection Time: 05/17/15  3:58 PM  Result Value Ref Range   Glucose-Capillary 176 (Marks) 65 - 99 mg/dL  Glucose, capillary     Status: Abnormal   Collection Time: 05/17/15  8:05 PM  Result Value Ref Range   Glucose-Capillary 144 (Marks) 65 - 99 mg/dL  Glucose, capillary     Status: Abnormal   Collection Time: 05/17/15 11:31 PM  Result Value Ref Range   Glucose-Capillary 148 (Marks) 65 - 99 mg/dL  Glucose, capillary     Status: Abnormal    Collection Time: 05/18/15  4:02 AM  Result Value Ref Range   Glucose-Capillary 165 (Marks) 65 - 99 mg/dL     Assessment/Plan:   NEURO  Altered Mental Status:  agitation and delirium   Plan: Not  Improving very much but does not seem to be suffering from the last episode of cardiac arrest  PULM  Atelectasis/collapse (focal)   Plan: Being treated for Staph pneumonia  CARDIO  Atrial Fibrillation (with rapid ventricular response)   Plan: On diltiazem drip.  Will need Cardiology consultation and possible per tube diltiazem  RENAL  Urine output is adequate.  BUN elevated with near normal creatinine.   Plan: Continue to follow these numbers  GI  No specific problems or issues.   Plan: Tube feedings are off.  Will resuem after surgery.  ID  Pneumonia (hospital acquired (not ventilator-associated) Staph pneumonia.  On clindamycin for this)   Plan: CPM  HEME  Anemia acute blood loss anemia and anemia of critical illness)   Plan: No blood.  WBC elevated likely because of the steroids  ENDO No specirfic issues   Plan: CPM  Global Issues  Respiratory failure and arrest secondary to losing his airway on several occasions.  Will perform tracheostomy today. For airway control.   LOS: 36 days   Additional comments:None  Critical Care Total Time*: 30 Minutes  [name] 05/18/2015  *Care during the described time interval was provided by me and/or other providers on the critical care team.  I have reviewed this patient's available data, including medical history, events of note, physical examination and test results as part of my evaluation.

## 2015-05-18 NOTE — Clinical Social Work Note (Signed)
Clinical Social Worker continuing to follow patient and family for support and discharge planning needs.  Patient had trach placed today and CM has facilitated conversation with patient family regarding potential LTAC placement.  CSW remains available for support and to assist with discharge planning needs if patient weans to trach collar.  Macario Golds, [place] [phone]

## 2015-05-18 NOTE — Anesthesia Postprocedure Evaluation (Signed)
Anesthesia Post Note  Patient: [name]  Procedure(s) Performed: Procedure(s) (LRB): TRACHEOSTOMY (N/A)  Patient location during evaluation: SICU Anesthesia Type: General Level of consciousness: sedated Pain management: pain level controlled Vital Signs Assessment: post-procedure vital signs reviewed and stable Respiratory status: patient remains intubated per anesthesia plan Cardiovascular status: stable Anesthetic complications: no Comments: Trach in place    Last Vitals:  Filed Vitals:   05/18/15 1200 05/18/15 1400  BP: 128/80 142/84  Pulse: 107 79  Temp: 36.8 C   Resp: 18 20    Last Pain:  Filed Vitals:   05/18/15 1413  PainSc: 0-No pain                 Reino Kent

## 2015-05-18 NOTE — Progress Notes (Signed)
Met with pt's wife and daughter to discuss discharge planning.  Pt for tracheostomy today, and pt may be ready for LTAC later this week.  Wife and daughter agreeable to this plan, and prefer [facility] for LTAC.  Will notify Gaspar Bidding, admissions liaison with [facility] to review case.  Order received from MD for [facility] consult.  Will follow up.    Reinaldo Raddle, RN, BSN  Trauma/Neuro ICU Case Manager [phone]

## 2015-05-18 NOTE — Anesthesia Procedure Notes (Signed)
Date/Time: 05/18/2015 12:48 PM Performed by: Wray Kearns A Pre-anesthesia Checklist: Patient identified, Timeout performed, Emergency Drugs available, Suction available and Patient being monitored Patient Re-evaluated:Patient Re-evaluated prior to inductionOxygen Delivery Method: Circle system utilized Preoxygenation: Pre-oxygenation with 100% oxygen Intubation Type: Inhalational induction with existing ETT and Combination inhalational/ intravenous induction Tube type: Oral Placement Confirmation: breath sounds checked- equal and bilateral and positive ETCO2 Dental Injury: Teeth and Oropharynx as per pre-operative assessment

## 2015-05-18 NOTE — Progress Notes (Signed)
ETCO2 changed. Previous ETCO2 wasn't working.
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
## 2015-05-18 NOTE — Progress Notes (Signed)
VASCULAR LAB PRELIMINARY  PRELIMINARY  PRELIMINARY  PRELIMINARY   Upper extremity right venous Doppler has been completed.  No DVT identified in right arm, Exam was limited due to picc line in right arm along with bandages.   Ladoris Lythgoe, RVT, RDMS 05/18/2015, 11:44 AM

## 2015-05-19 ENCOUNTER — Other Ambulatory Visit: Payer: Self-pay

## 2015-05-19 ENCOUNTER — Encounter (HOSPITAL_COMMUNITY): Payer: Self-pay | Admitting: General Surgery

## 2015-05-19 ENCOUNTER — Inpatient Hospital Stay (HOSPITAL_COMMUNITY): Payer: No Typology Code available for payment source

## 2015-05-19 DIAGNOSIS — I5033 Acute on chronic diastolic (congestive) heart failure: Secondary | ICD-10-CM

## 2015-05-19 LAB — GLUCOSE, CAPILLARY
GLUCOSE-CAPILLARY: 154 mg/dL — AB (ref 65–99)
GLUCOSE-CAPILLARY: 195 mg/dL — AB (ref 65–99)
Glucose-Capillary: 146 mg/dL — ABNORMAL HIGH (ref 65–99)
Glucose-Capillary: 180 mg/dL — ABNORMAL HIGH (ref 65–99)
Glucose-Capillary: 185 mg/dL — ABNORMAL HIGH (ref 65–99)
Glucose-Capillary: 168 mg/dL — ABNORMAL HIGH (ref 65–99)

## 2015-05-19 LAB — CBC WITH DIFFERENTIAL/PLATELET
Basophils Absolute: 0 10*3/uL (ref 0.0–0.1)
Basophils Relative: 0 %
EOS PCT: 0 %
Eosinophils Absolute: 0 10*3/uL (ref 0.0–0.7)
HEMATOCRIT: 30.4 % — AB (ref 39.0–52.0)
HEMOGLOBIN: 9.4 g/dL — AB (ref 13.0–17.0)
LYMPHS ABS: 1.1 10*3/uL (ref 0.7–4.0)
Lymphocytes Relative: 7 %
MCH: 29.3 pg (ref 26.0–34.0)
MCHC: 30.9 g/dL (ref 30.0–36.0)
MCV: 94.7 fL (ref 78.0–100.0)
MONOS PCT: 6 %
Monocytes Absolute: 1 10*3/uL (ref 0.1–1.0)
Neutro Abs: 13.8 10*3/uL — ABNORMAL HIGH (ref 1.7–7.7)
Neutrophils Relative %: 87 %
Platelets: 137 10*3/uL — ABNORMAL LOW (ref 150–400)
RBC: 3.21 MIL/uL — AB (ref 4.22–5.81)
RDW: 16.4 % — ABNORMAL HIGH (ref 11.5–15.5)
WBC: 15.9 10*3/uL — AB (ref 4.0–10.5)

## 2015-05-19 LAB — BASIC METABOLIC PANEL
ANION GAP: 8 (ref 5–15)
BUN: 32 mg/dL — AB (ref 6–20)
CALCIUM: 8.4 mg/dL — AB (ref 8.9–10.3)
CHLORIDE: 114 mmol/L — AB (ref 101–111)
CO2: 21 mmol/L — AB (ref 22–32)
Creatinine, Ser: 0.93 mg/dL (ref 0.61–1.24)
Glucose, Bld: 156 mg/dL — ABNORMAL HIGH (ref 65–99)
POTASSIUM: 3.8 mmol/L (ref 3.5–5.1)
Sodium: 143 mmol/L (ref 135–145)

## 2015-05-19 LAB — MAGNESIUM: Magnesium: 1.6 mg/dL — ABNORMAL LOW (ref 1.7–2.4)

## 2015-05-19 MED ORDER — AMIODARONE LOAD VIA INFUSION
150.0000 mg | Freq: Once | INTRAVENOUS | Status: AC
  Administered 2015-05-19: 150 mg via INTRAVENOUS
  Filled 2015-05-19: qty 83.34

## 2015-05-19 MED ORDER — JEVITY 1.2 CAL PO LIQD
1000.0000 mL | ORAL | Status: DC
  Administered 2015-05-19: 75 mL
  Administered 2015-05-20: 1000 mL
  Filled 2015-05-19 (×5): qty 1000

## 2015-05-19 MED ORDER — AMIODARONE HCL IN DEXTROSE 360-4.14 MG/200ML-% IV SOLN
60.0000 mg/h | INTRAVENOUS | Status: AC
  Administered 2015-05-19 (×2): 60 mg/h via INTRAVENOUS
  Filled 2015-05-19: qty 200

## 2015-05-19 MED ORDER — AMIODARONE HCL IN DEXTROSE 360-4.14 MG/200ML-% IV SOLN
30.0000 mg/h | INTRAVENOUS | Status: DC
  Administered 2015-05-19 – 2015-05-20 (×2): 30 mg/h via INTRAVENOUS
  Filled 2015-05-19 (×3): qty 200

## 2015-05-19 MED ORDER — QUETIAPINE FUMARATE 100 MG PO TABS
50.0000 mg | ORAL_TABLET | Freq: Two times a day (BID) | ORAL | Status: DC
  Administered 2015-05-19 – 2015-05-20 (×3): 50 mg
  Filled 2015-05-19 (×3): qty 1

## 2015-05-19 MED ORDER — FUROSEMIDE 10 MG/ML IJ SOLN
40.0000 mg | Freq: Once | INTRAMUSCULAR | Status: AC
  Administered 2015-05-19: 40 mg via INTRAVENOUS
  Filled 2015-05-19: qty 4
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
## 2015-05-19 NOTE — Progress Notes (Signed)
Dr. Donell Beers notified that pt reading a flutter on lead 2. EKG performed showing a flutter. Results read to Dr. Donell Beers. Last potassium and magnesium levels read to Dr. Donell Beers. Orders received for magnesium level to be drawn. Pt neuro status other wise unchanged. Will continue to monitor.

## 2015-05-19 NOTE — Progress Notes (Signed)
Per MD foley catheter is to stay in place due penile swelling. Will continue to reassess daily

## 2015-05-19 NOTE — Progress Notes (Signed)
Follow up - Trauma and Critical Care  Patient Details:    [name] is an 82 y.o. male.  Lines/tubes : PICC Triple Lumen 04/19/15 PICC Right Basilic 37 cm 0 cm (Active)  Indication for Insertion or Continuance of Line Administration of hyperosmolar/irritating solutions (i.e. TPN, Vancomycin, etc.) 05/19/2015  8:00 AM  Exposed Catheter (cm) 0 cm 05/07/2015  6:00 AM  Site Assessment Edematous;Intact;Clean;Dry 05/18/2015  8:00 PM  Lumen #1 Status Infusing 05/18/2015  8:00 PM  Lumen #2 Status Infusing 05/18/2015  8:00 PM  Lumen #3 Status Flushed;Saline locked 05/18/2015  8:00 PM  Dressing Type Transparent;Occlusive 05/18/2015  8:00 PM  Dressing Status Clean;Dry;Intact;Antimicrobial disc in place 05/18/2015  8:00 PM  Line Care Connections checked and tightened 05/18/2015  8:00 PM  Line Adjustment (NICU/IV Team Only) No 04/19/2015 12:57 PM  Dressing Intervention New dressing;Antimicrobial disc changed;Dressing changed 05/14/2015  4:30 PM  Dressing Change Due 05/21/15 05/18/2015  8:00 PM     Gastrostomy/Enterostomy Percutaneous endoscopic gastrostomy (PEG) 24 Fr. LUQ (Active)  Surrounding Skin Intact 05/18/2015  8:00 PM  Tube Status Patent 05/18/2015  8:00 PM  Drainage Appearance Tan 05/16/2015  8:00 PM  Dressing Status Clean;Dry;Intact 05/18/2015  8:00 PM  Dressing Intervention Dressing changed 05/18/2015  5:00 AM  Dressing Type Split gauze;Abdominal Binder 05/18/2015  8:00 PM  G Port Intake (mL) 50 ml 05/18/2015 12:00 AM  J Port Intake (mL) 50 ml 05/19/2015  5:00 AM  Output (mL) 250 mL 05/15/2015  5:00 AM     Urethral Catheter Taryn H Straight-tip (Active)  Indication for Insertion or Continuance of Catheter Other (comment) 05/19/2015  8:00 AM  Site Assessment Clean;Intact 05/18/2015  8:00 PM  Catheter Maintenance Bag below level of bladder;Catheter secured;Seal intact;Drainage bag/tubing not touching floor;Insertion date on drainage bag 05/19/2015  8:00 AM  Collection Container Standard drainage bag 05/18/2015  8:00 PM   Securement Method Securing device (Describe) 05/18/2015  8:00 PM  Urinary Catheter Interventions Unclamped 05/18/2015  8:00 PM  Output (mL) 60 mL 05/19/2015  6:00 AM    Microbiology/Sepsis markers: Results for orders placed or performed during the hospital encounter of 04/12/15  MRSA PCR Screening     Status: None   Collection Time: 04/12/15  7:04 AM  Result Value Ref Range Status   MRSA by PCR NEGATIVE NEGATIVE Final    Comment:        The GeneXpert MRSA Assay (FDA approved for NASAL specimens only), is one component of a comprehensive MRSA colonization surveillance program. It is not intended to diagnose MRSA infection nor to guide or monitor treatment for MRSA infections.   Culture, respiratory (NON-Expectorated)     Status: None   Collection Time: 04/20/15  8:39 AM  Result Value Ref Range Status   Specimen Description TRACHEAL ASPIRATE  Final   Special Requests Normal  Final   Gram Stain   Final    ABUNDANT WBC PRESENT,BOTH PMN AND MONONUCLEAR NO SQUAMOUS EPITHELIAL CELLS SEEN NO ORGANISMS SEEN Performed at [facility]    Culture   Final    NORMAL OROPHARYNGEAL FLORA Performed at [facility]    Report Status 04/22/2015 FINAL  Final  Culture, respiratory (NON-Expectorated)     Status: None   Collection Time: 04/23/15  2:45 PM  Result Value Ref Range Status   Specimen Description TRACHEAL ASPIRATE  Final   Special Requests NONE  Final   Gram Stain   Final    MODERATE WBC PRESENT,BOTH PMN AND MONONUCLEAR FEW SQUAMOUS EPITHELIAL  CELLS PRESENT MODERATE GRAM POSITIVE COCCI IN PAIRS IN CLUSTERS Performed at [facility]    Culture   Final    NORMAL OROPHARYNGEAL FLORA Performed at [facility]    Report Status 04/26/2015 FINAL  Final  Culture, bal-quantitative     Status: None   Collection Time: 04/23/15  3:43 PM  Result Value Ref Range Status   Specimen Description BRONCHIAL ALVEOLAR LAVAGE  Final   Special Requests NONE  Final    Gram Stain   Final    FEW WBC PRESENT, PREDOMINANTLY MONONUCLEAR ABUNDANT SQUAMOUS EPITHELIAL CELLS PRESENT MODERATE GRAM POSITIVE COCCI IN PAIRS IN CLUSTERS Performed at [facility]   Final    >=100,000 COLONIES/ML Performed at [facility]    Culture   Final    NORMAL OROPHARYNGEAL FLORA Performed at [facility]    Report Status 04/26/2015 FINAL  Final  Culture, blood (Routine X 2) w Reflex to ID Panel     Status: None   Collection Time: 05/12/15 11:59 PM  Result Value Ref Range Status   Specimen Description BLOOD LEFT ARM  Final   Special Requests IN PEDIATRIC BOTTLE 0.5ML  Final   Culture  Setup Time   Final    GRAM POSITIVE COCCI IN CLUSTERS AEROBIC BOTTLE ONLY CRITICAL RESULT CALLED TO, READ BACK BY AND VERIFIED WITH: Silas Flood RN 1924 05/13/15 A BROWNING    Culture   Final    STAPHYLOCOCCUS SPECIES (COAGULASE NEGATIVE) THE SIGNIFICANCE OF ISOLATING THIS ORGANISM FROM A SINGLE SET OF BLOOD CULTURES WHEN MULTIPLE SETS ARE DRAWN IS UNCERTAIN. PLEASE NOTIFY THE MICROBIOLOGY DEPARTMENT WITHIN ONE WEEK IF SPECIATION AND SENSITIVITIES ARE REQUIRED.    Report Status 05/14/2015 FINAL  Final  Culture, blood (Routine X 2) w Reflex to ID Panel     Status: None   Collection Time: 05/13/15 12:08 AM  Result Value Ref Range Status   Specimen Description BLOOD LEFT ARM  Final   Special Requests IN PEDIATRIC BOTTLE  Final   Culture NO GROWTH 5 DAYS  Final   Report Status 05/18/2015 FINAL  Final  Culture, Urine     Status: None   Collection Time: 05/13/15  3:30 AM  Result Value Ref Range Status   Specimen Description URINE, CATHETERIZED  Final   Special Requests NONE  Final   Culture NO GROWTH 1 DAY  Final   Report Status 05/14/2015 FINAL  Final  Culture, respiratory (NON-Expectorated)     Status: None   Collection Time: 05/13/15  4:50 AM  Result Value Ref Range Status   Specimen Description TRACHEAL ASPIRATE  Final   Special  Requests NONE  Final   Gram Stain   Final    ABUNDANT WBC PRESENT,BOTH PMN AND MONONUCLEAR NO SQUAMOUS EPITHELIAL CELLS SEEN ABUNDANT GRAM POSITIVE COCCI IN PAIRS IN CLUSTERS FEW GRAM NEGATIVE RODS Performed at [facility]    Culture   Final    ABUNDANT STAPHYLOCOCCUS AUREUS Note: RIFAMPIN AND GENTAMICIN SHOULD NOT BE USED AS SINGLE DRUGS FOR TREATMENT OF STAPH INFECTIONS. Performed at [facility]    Report Status 05/16/2015 FINAL  Final   Organism ID, Bacteria STAPHYLOCOCCUS AUREUS  Final      Susceptibility   Staphylococcus aureus - MIC*    CLINDAMYCIN <=0.25 SENSITIVE Sensitive     ERYTHROMYCIN <=0.25 SENSITIVE Sensitive     GENTAMICIN <=0.5 SENSITIVE Sensitive     LEVOFLOXACIN 0.25 SENSITIVE Sensitive  OXACILLIN 0.5 SENSITIVE Sensitive     RIFAMPIN <=0.5 SENSITIVE Sensitive     TRIMETH/SULFA <=10 SENSITIVE Sensitive     VANCOMYCIN <=0.5 SENSITIVE Sensitive     TETRACYCLINE <=1 SENSITIVE Sensitive     MOXIFLOXACIN <=0.25 SENSITIVE Sensitive     * ABUNDANT STAPHYLOCOCCUS AUREUS    Anti-infectives:  Anti-infectives    Start     Dose/Rate Route Frequency Ordered Stop   05/17/15 1200  clindamycin (CLEOCIN) 75 MG/5ML solution 300 mg     300 mg Per Tube 4 times per day 05/17/15 1026 05/28/2015 1159   05/15/15 1200  vancomycin (VANCOCIN) IVPB 750 mg/150 ml premix  Status:  Discontinued     750 mg 150 mL/hr over 60 Minutes Intravenous Every 12 hours 05/15/15 0742 05/17/15 1026   05/15/15 0900  cefTAZidime (FORTAZ) 1 g in dextrose 5 % 50 mL IVPB  Status:  Discontinued     1 g 100 mL/hr over 30 Minutes Intravenous Every 8 hours 05/15/15 0746 05/16/15 1001   05/12/15 2300  vancomycin (VANCOCIN) 1,250 mg in sodium chloride 0.9 % 250 mL IVPB  Status:  Discontinued     1,250 mg 166.7 mL/hr over 90 Minutes Intravenous Every 24 hours 05/12/15 2244 05/15/15 0742   05/12/15 2300  cefTAZidime (FORTAZ) 1 g in dextrose 5 % 50 mL IVPB  Status:  Discontinued     1 g 100  mL/hr over 30 Minutes Intravenous Every 12 hours 05/12/15 2246 05/15/15 0746   05/12/15 2245  ceFEPIme (MAXIPIME) 2 g in dextrose 5 % 50 mL IVPB  Status:  Discontinued     2 g 100 mL/hr over 30 Minutes Intravenous Every 12 hours 05/12/15 2243 05/12/15 2245   05/01/15 0416  ceFEPIme (MAXIPIME) 2 g in dextrose 5 % 50 mL IVPB     2 g 100 mL/hr over 30 Minutes Intravenous Every 24 hours 04/30/15 1352 05/02/15 0507   04/27/15 0400  ceFEPIme (MAXIPIME) 2 g in dextrose 5 % 50 mL IVPB  Status:  Discontinued     2 g 100 mL/hr over 30 Minutes Intravenous Every 24 hours 04/26/15 0814 04/26/15 0833   04/26/15 2000  vancomycin (VANCOCIN) IVPB 750 mg/150 ml premix  Status:  Discontinued     750 mg 150 mL/hr over 60 Minutes Intravenous Every 12 hours 04/26/15 0817 04/27/15 0900   04/26/15 1600  ceFEPIme (MAXIPIME) 2 g in dextrose 5 % 50 mL IVPB  Status:  Discontinued     2 g 100 mL/hr over 30 Minutes Intravenous Every 12 hours 04/26/15 0833 04/30/15 1352   04/24/15 2000  vancomycin (VANCOCIN) 1,250 mg in sodium chloride 0.9 % 250 mL IVPB  Status:  Discontinued     1,250 mg 166.7 mL/hr over 90 Minutes Intravenous Every 12 hours 04/24/15 0805 04/26/15 0817   04/24/15 1300  ceFEPIme (MAXIPIME) 2 g in dextrose 5 % 50 mL IVPB  Status:  Discontinued     2 g 100 mL/hr over 30 Minutes Intravenous Every 8 hours 04/24/15 0813 04/26/15 0814   04/23/15 1600  vancomycin (VANCOCIN) IVPB 750 mg/150 ml premix  Status:  Discontinued     750 mg 150 mL/hr over 60 Minutes Intravenous Every 12 hours 04/23/15 1513 04/24/15 0805   04/23/15 1530  ceFEPIme (MAXIPIME) 2 g in dextrose 5 % 50 mL IVPB  Status:  Discontinued     2 g 100 mL/hr over 30 Minutes Intravenous Every 12 hours 04/23/15 1504 04/24/15 0813   04/20/15 2200  ceFEPIme (MAXIPIME)  2 g in dextrose 5 % 50 mL IVPB  Status:  Discontinued     2 g 100 mL/hr over 30 Minutes Intravenous Every 12 hours 04/20/15 1124 04/22/15 0847   04/14/15 2230  vancomycin (VANCOCIN)  IVPB 750 mg/150 ml premix  Status:  Discontinued     750 mg 150 mL/hr over 60 Minutes Intravenous Every 12 hours 04/14/15 1508 04/15/15 1130   04/14/15 1338  vancomycin (VANCOCIN) 1000 MG powder  Status:  Discontinued    Comments:  Ratcliff, Esther   : cabinet override      04/14/15 1338 04/14/15 1343   04/14/15 1213  vancomycin (VANCOCIN) powder  Status:  Discontinued       As needed 04/14/15 1214 04/14/15 1333   04/14/15 1211  vancomycin (VANCOCIN) 1000 MG powder    Comments:  Ratcliff, Esther   : cabinet override      04/14/15 1211 04/15/15 0014   04/14/15 1147  bacitracin 50,000 Units in sodium chloride irrigation 0.9 % 500 mL irrigation  Status:  Discontinued       As needed 04/14/15 1147 04/14/15 1333   04/14/15 0600  vancomycin (VANCOCIN) IVPB 1000 mg/200 mL premix     1,000 mg 200 mL/hr over 60 Minutes Intravenous To Neuro OR-Station #32 04/13/15 1602 04/14/15 1145   04/13/15 1100  ceFEPIme (MAXIPIME) 2 g in dextrose 5 % 50 mL IVPB  Status:  Discontinued     2 g 100 mL/hr over 30 Minutes Intravenous Every 24 hours 04/13/15 1017 04/20/15 1124   04/12/15 0445  ceFAZolin (ANCEF) IVPB 1 g/50 mL premix     1 g 100 mL/hr over 30 Minutes Intravenous  Once 04/12/15 0430 04/12/15 0535      Best Practice/Protocols:  VTE Prophylaxis: Lovenox (prophylaxtic dose) and Mechanical GI Prophylaxis: Proton Pump Inhibitor Continous Sedation fentanyl and Klonopin only  Consults:      Events:  Subjective:    Overnight Issues: No distress.  More awake this AM  Objective:  Vital signs for last 24 hours: Temp:  [97.4 F (36.3 C)-98.2 F (36.8 C)] 98.1 F (36.7 C) (02/07 0400) Pulse Rate:  [57-129] 82 (02/07 0700) Resp:  [18-26] 18 (02/07 0700) BP: (88-142)/(58-96) 104/81 mmHg (02/07 0700) SpO2:  [94 %-100 %] 100 % (02/07 0700) FiO2 (%):  [30 %-40 %] 40 % (02/07 0700) Weight:  [103 kg (227 lb 1.2 oz)] 103 kg (227 lb 1.2 oz) (02/07 0500)  Hemodynamic parameters for last 24  hours: CVP:  [17 mmHg] 17 mmHg  Intake/Output from previous day: 02/06 0701 - 02/07 0700 In: 5705.1 [I.V.:3935.1; NG/GT:1560] Out: 1795 [Urine:1790; Blood:5]  Intake/Output this shift:    Vent settings for last 24 hours: Vent Mode:  [-] PRVC FiO2 (%):  [30 %-40 %] 40 % Set Rate:  [20 bmp] 20 bmp Vt Set:  [490 mL-590 mL] 490 mL PEEP:  [5 cmH20] 5 cmH20 Pressure Support:  [8 cmH20] 8 cmH20 Plateau Pressure:  [11 cmH20-19 cmH20] 11 cmH20  Physical Exam:  General: no respiratory distress and Awake but not alert Neuro: nonfocal exam and agitated Resp: wheezes bilaterally CVS: IRR and Atrial fibrillation with RVR on cardizem drip Extremities: no edema, no erythema, pulses WNL and edema 2+  Results for orders placed or performed during the hospital encounter of 04/12/15 (from the past 24 hour(s))  Glucose, capillary     Status: Abnormal   Collection Time: 05/18/15  8:23 AM  Result Value Ref Range   Glucose-Capillary 144 (H) 65 -  99 mg/dL   Comment 1 Notify RN    Comment 2 Document in Chart   Glucose, capillary     Status: Abnormal   Collection Time: 05/18/15 11:49 AM  Result Value Ref Range   Glucose-Capillary 124 (H) 65 - 99 mg/dL   Comment 1 Notify RN    Comment 2 Document in Chart   Glucose, capillary     Status: Abnormal   Collection Time: 05/18/15  3:48 PM  Result Value Ref Range   Glucose-Capillary 146 (H) 65 - 99 mg/dL   Comment 1 Notify RN    Comment 2 Document in Chart   Glucose, capillary     Status: Abnormal   Collection Time: 05/18/15  8:01 PM  Result Value Ref Range   Glucose-Capillary 146 (H) 65 - 99 mg/dL  Glucose, capillary     Status: Abnormal   Collection Time: 05/18/15 11:27 PM  Result Value Ref Range   Glucose-Capillary 134 (H) 65 - 99 mg/dL  Glucose, capillary     Status: Abnormal   Collection Time: 05/19/15  4:06 AM  Result Value Ref Range   Glucose-Capillary 154 (H) 65 - 99 mg/dL  CBC with Differential/Platelet     Status: Abnormal    Collection Time: 05/19/15  6:00 AM  Result Value Ref Range   WBC 15.9 (H) 4.0 - 10.5 K/uL   RBC 3.21 (L) 4.22 - 5.81 MIL/uL   Hemoglobin 9.4 (L) 13.0 - 17.0 g/dL   HCT 16.1 (L) 09.6 - 04.5 %   MCV 94.7 78.0 - 100.0 fL   MCH 29.3 26.0 - 34.0 pg   MCHC 30.9 30.0 - 36.0 g/dL   RDW 40.9 (H) 81.1 - 91.4 %   Platelets 137 (L) 150 - 400 K/uL   Neutrophils Relative % 87 %   Lymphocytes Relative 7 %   Monocytes Relative 6 %   Eosinophils Relative 0 %   Basophils Relative 0 %   Neutro Abs 13.8 (H) 1.7 - 7.7 K/uL   Lymphs Abs 1.1 0.7 - 4.0 K/uL   Monocytes Absolute 1.0 0.1 - 1.0 K/uL   Eosinophils Absolute 0.0 0.0 - 0.7 K/uL   Basophils Absolute 0.0 0.0 - 0.1 K/uL   RBC Morphology POLYCHROMASIA PRESENT    WBC Morphology MILD LEFT SHIFT (1-5% METAS, OCC MYELO, OCC BANDS)   Basic metabolic panel     Status: Abnormal   Collection Time: 05/19/15  6:00 AM  Result Value Ref Range   Sodium 143 135 - 145 mmol/L   Potassium 3.8 3.5 - 5.1 mmol/L   Chloride 114 (H) 101 - 111 mmol/L   CO2 21 (L) 22 - 32 mmol/L   Glucose, Bld 156 (H) 65 - 99 mg/dL   BUN 32 (H) 6 - 20 mg/dL   Creatinine, Ser 7.82 0.61 - 1.24 mg/dL   Calcium 8.4 (L) 8.9 - 10.3 mg/dL   GFR calc non Af Amer >60 >60 mL/min   GFR calc Af Amer >60 >60 mL/min   Anion gap 8 5 - 15     Assessment/Plan:   NEURO  Altered Mental Status:  agitation, delirium and sedation   Plan: Adjust and add seroquel  PULM  Atelectasis/collapse (focal and bilateral and bibasilar atelectasis)   Plan: DuoNeb, Lasix  CARDIO  Atrial Fibrillation (with rapid ventricular response)   Plan: Will add amiodarone and Cardiology consultation.  RENAL  Urine output and renal function improved.   Plan: Lasix today now that BUN is improving.  GI  Tolerating tube feedigns at goal   Plan: CPM  ID  Pneumonia (hospital acquired (not ventilator-associated) Staph pneumonia getting clindamydin)   Plan: CPM  HEME  Anemia acute blood loss anemia and anemia of  critical illness)   Plan: No blood.  WBC improved also.  ENDO Glucose under control   Plan: CPM  Global Issues  Wheezing may be fluid overload and with diurese this AM  Will try to wean on the ventilator now that the patient has a tracheostomy.  Cardiology consultation for recalcitrant atrial fibrillation.      LOS: 37 days   Additional comments:I reviewed the patient's new clinical lab test results. cbc/bmet and I reviewed the patients new imaging test results. cxr is pending and will be reviewed.  Critical Care Total Time*: 30 Minutes  Bynum Mccullars 05/19/2015  *Care during the described time interval was provided by me and/or other providers on the critical care team.  I have reviewed this patient's available data, including medical history, events of note, physical examination and test results as part of my evaluation.

## 2015-05-20 ENCOUNTER — Inpatient Hospital Stay
Admission: RE | Admit: 2015-05-20 | Discharge: 2015-07-11 | Disposition: E | Payer: Medicare Other | Source: Ambulatory Visit | Attending: Internal Medicine | Admitting: Internal Medicine

## 2015-05-20 ENCOUNTER — Other Ambulatory Visit (HOSPITAL_COMMUNITY): Payer: Self-pay

## 2015-05-20 ENCOUNTER — Inpatient Hospital Stay (HOSPITAL_COMMUNITY): Payer: No Typology Code available for payment source

## 2015-05-20 DIAGNOSIS — Z66 Do not resuscitate: Secondary | ICD-10-CM | POA: Diagnosis present

## 2015-05-20 DIAGNOSIS — J9600 Acute respiratory failure, unspecified whether with hypoxia or hypercapnia: Secondary | ICD-10-CM | POA: Diagnosis present

## 2015-05-20 DIAGNOSIS — I4891 Unspecified atrial fibrillation: Secondary | ICD-10-CM

## 2015-05-20 DIAGNOSIS — N19 Unspecified kidney failure: Secondary | ICD-10-CM

## 2015-05-20 DIAGNOSIS — G931 Anoxic brain damage, not elsewhere classified: Secondary | ICD-10-CM | POA: Diagnosis present

## 2015-05-20 DIAGNOSIS — Z431 Encounter for attention to gastrostomy: Secondary | ICD-10-CM

## 2015-05-20 DIAGNOSIS — S32029A Unspecified fracture of second lumbar vertebra, initial encounter for closed fracture: Secondary | ICD-10-CM | POA: Diagnosis present

## 2015-05-20 DIAGNOSIS — J9620 Acute and chronic respiratory failure, unspecified whether with hypoxia or hypercapnia: Secondary | ICD-10-CM | POA: Diagnosis present

## 2015-05-20 DIAGNOSIS — Z930 Tracheostomy status: Secondary | ICD-10-CM

## 2015-05-20 DIAGNOSIS — R410 Disorientation, unspecified: Secondary | ICD-10-CM | POA: Diagnosis present

## 2015-05-20 DIAGNOSIS — Z95828 Presence of other vascular implants and grafts: Secondary | ICD-10-CM

## 2015-05-20 DIAGNOSIS — R0902 Hypoxemia: Secondary | ICD-10-CM

## 2015-05-20 DIAGNOSIS — J189 Pneumonia, unspecified organism: Secondary | ICD-10-CM | POA: Diagnosis present

## 2015-05-20 DIAGNOSIS — R4182 Altered mental status, unspecified: Secondary | ICD-10-CM

## 2015-05-20 DIAGNOSIS — S32039A Unspecified fracture of third lumbar vertebra, initial encounter for closed fracture: Secondary | ICD-10-CM | POA: Diagnosis present

## 2015-05-20 DIAGNOSIS — S299XXA Unspecified injury of thorax, initial encounter: Secondary | ICD-10-CM | POA: Diagnosis present

## 2015-05-20 DIAGNOSIS — S12100A Unspecified displaced fracture of second cervical vertebra, initial encounter for closed fracture: Secondary | ICD-10-CM | POA: Diagnosis present

## 2015-05-20 DIAGNOSIS — J9690 Respiratory failure, unspecified, unspecified whether with hypoxia or hypercapnia: Secondary | ICD-10-CM

## 2015-05-20 DIAGNOSIS — S2239XA Fracture of one rib, unspecified side, initial encounter for closed fracture: Secondary | ICD-10-CM | POA: Diagnosis present

## 2015-05-20 LAB — CBC WITH DIFFERENTIAL/PLATELET
BASOS ABS: 0 10*3/uL (ref 0.0–0.1)
BASOS PCT: 0 %
EOS ABS: 0 10*3/uL (ref 0.0–0.7)
EOS PCT: 0 %
HCT: 30.1 % — ABNORMAL LOW (ref 39.0–52.0)
HEMOGLOBIN: 9 g/dL — AB (ref 13.0–17.0)
Lymphocytes Relative: 8 %
Lymphs Abs: 1.2 10*3/uL (ref 0.7–4.0)
MCH: 27.7 pg (ref 26.0–34.0)
MCHC: 29.9 g/dL — ABNORMAL LOW (ref 30.0–36.0)
MCV: 92.6 fL (ref 78.0–100.0)
Monocytes Absolute: 0.6 10*3/uL (ref 0.1–1.0)
Monocytes Relative: 4 %
NEUTROS PCT: 88 %
Neutro Abs: 13.1 10*3/uL — ABNORMAL HIGH (ref 1.7–7.7)
PLATELETS: 150 10*3/uL (ref 150–400)
RBC: 3.25 MIL/uL — AB (ref 4.22–5.81)
RDW: 16.9 % — ABNORMAL HIGH (ref 11.5–15.5)
WBC: 14.9 10*3/uL — AB (ref 4.0–10.5)

## 2015-05-20 LAB — BLOOD GAS, ARTERIAL
Acid-base deficit: 0.5 mmol/L (ref 0.0–2.0)
BICARBONATE: 23.1 meq/L (ref 20.0–24.0)
FIO2: 0.4
LHR: 20 {breaths}/min
O2 SAT: 96.8 %
PATIENT TEMPERATURE: 98.6
PCO2 ART: 34.1 mmHg — AB (ref 35.0–45.0)
PEEP: 5 cmH2O
TCO2: 24.2 mmol/L (ref 0–100)
VT: 590 mL
pH, Arterial: 7.44 (ref 7.350–7.450)
pO2, Arterial: 86 mmHg (ref 80.0–100.0)

## 2015-05-20 LAB — GLUCOSE, CAPILLARY
Glucose-Capillary: 163 mg/dL — ABNORMAL HIGH (ref 65–99)
Glucose-Capillary: 153 mg/dL — ABNORMAL HIGH (ref 65–99)
Glucose-Capillary: 165 mg/dL — ABNORMAL HIGH (ref 65–99)
Glucose-Capillary: 130 mg/dL — ABNORMAL HIGH (ref 65–99)

## 2015-05-20 LAB — BASIC METABOLIC PANEL
Anion gap: 6 (ref 5–15)
BUN: 36 mg/dL — AB (ref 6–20)
CO2: 24 mmol/L (ref 22–32)
Calcium: 8.5 mg/dL — ABNORMAL LOW (ref 8.9–10.3)
Chloride: 114 mmol/L — ABNORMAL HIGH (ref 101–111)
Creatinine, Ser: 0.98 mg/dL (ref 0.61–1.24)
GFR calc Af Amer: >60 mL/min (ref >60)
GLUCOSE: 168 mg/dL — AB (ref 65–99)
POTASSIUM: 3.9 mmol/L (ref 3.5–5.1)
Sodium: 144 mmol/L (ref 135–145)

## 2015-05-20 LAB — MRSA PCR SCREENING: MRSA by PCR: NEGATIVE

## 2015-05-20 MED ORDER — MIDAZOLAM HCL 2 MG/2ML IJ SOLN: 2.0000 mg | INTRAMUSCULAR | Status: AC | PRN

## 2015-05-20 MED ORDER — SODIUM CHLORIDE 0.9 % IV SOLN: 50.0000 ug/h | INTRAVENOUS | Status: AC

## 2015-05-20 MED ORDER — AMIODARONE HCL IN DEXTROSE 360-4.14 MG/200ML-% IV SOLN: 30.0000 mg/h | INTRAVENOUS | Status: AC

## 2015-05-20 MED ORDER — HYDROCORTISONE NA SUCCINATE PF 100 MG IJ SOLR
50.0000 mg | Freq: Three times a day (TID) | INTRAMUSCULAR | Status: AC
Start: 2015-05-20 — End: ?

## 2015-05-20 MED ORDER — FUROSEMIDE 10 MG/ML PO SOLN: 40.0000 mg | Freq: Two times a day (BID) | ORAL | Status: AC

## 2015-05-20 MED ORDER — INSULIN ASPART 100 UNIT/ML ~~LOC~~ SOLN: 2.0000 [IU] | SUBCUTANEOUS | Status: AC

## 2015-05-20 MED ORDER — IPRATROPIUM-ALBUTEROL 0.5-2.5 (3) MG/3ML IN SOLN: 3.0000 mL | RESPIRATORY_TRACT | Status: AC | PRN

## 2015-05-20 MED ORDER — FUROSEMIDE 10 MG/ML PO SOLN
40.0000 mg | Freq: Two times a day (BID) | ORAL | Status: DC
  Administered 2015-05-20 (×2): 40 mg
  Filled 2015-05-20 (×3): qty 4

## 2015-05-20 MED ORDER — QUETIAPINE FUMARATE 50 MG PO TABS: 50.0000 mg | ORAL_TABLET | Freq: Two times a day (BID) | ORAL | Status: AC

## 2015-05-20 MED ORDER — DILTIAZEM HCL 100 MG IV SOLR: 5.0000 mg/h | INTRAVENOUS | Status: AC

## 2015-05-20 MED ORDER — DILTIAZEM 12 MG/ML ORAL SUSPENSION: 30.0000 mg | Freq: Four times a day (QID) | ORAL | Status: AC

## 2015-05-20 MED ORDER — JEVITY 1.2 CAL PO LIQD: 1000.0000 mL | ORAL | Status: AC

## 2015-05-20 MED ORDER — PANTOPRAZOLE SODIUM 40 MG IV SOLR: 40.0000 mg | Freq: Two times a day (BID) | INTRAVENOUS | Status: AC

## 2015-05-20 MED ORDER — FREE WATER: 200.0000 mL | Freq: Four times a day (QID) | Status: AC

## 2015-05-20 MED ORDER — LORAZEPAM 2 MG/ML IJ SOLN: 1.0000 mg | INTRAMUSCULAR | Status: AC | PRN

## 2015-05-20 MED ORDER — ONDANSETRON HCL 4 MG/2ML IJ SOLN: 4.0000 mg | Freq: Four times a day (QID) | INTRAMUSCULAR | Status: AC | PRN

## 2015-05-20 MED ORDER — CLONAZEPAM 0.5 MG PO TABS: 0.5000 mg | ORAL_TABLET | Freq: Two times a day (BID) | ORAL | Status: AC

## 2015-05-20 MED ORDER — SENNOSIDES 8.8 MG/5ML PO SYRP: 5.0000 mL | ORAL_SOLUTION | Freq: Two times a day (BID) | ORAL | Status: AC | PRN

## 2015-05-20 MED ORDER — FENTANYL BOLUS VIA INFUSION: 50.0000 ug | INTRAVENOUS | Status: AC | PRN

## 2015-05-20 MED ORDER — CHLORHEXIDINE GLUCONATE 0.12% ORAL RINSE (MEDLINE KIT)
15.0000 mL | Freq: Two times a day (BID) | OROMUCOSAL | Status: AC
Start: 2015-05-20 — End: ?

## 2015-05-20 MED ORDER — MIDAZOLAM BOLUS VIA INFUSION: 1.0000 mg | INTRAVENOUS | Status: AC | PRN

## 2015-05-20 MED ORDER — MAGNESIUM HYDROXIDE 400 MG/5ML PO SUSP: 30.0000 mL | Freq: Every day | ORAL | Status: AC

## 2015-05-20 NOTE — Progress Notes (Signed)
Notified Dr. Donell Beers that patients HR increases to 130s occasionally and comes back down to 108-115s. Currently on diltiazem gtt and amiodarone gtt. No new orders at this time. Maintain SBP of >90. Will continue to monitor

## 2015-05-20 NOTE — Progress Notes (Signed)
    Subjective:  No meaningful history obtainable. The patient is nonverbal. A tracheostomy is in place. His son is at the bedside and I have discussed his case at length.  Objective:  Vital Signs in the last 24 hours: Temp:  [98.3 F (36.8 C)-98.8 F (37.1 C)] 98.3 F (36.8 C) (02/08 1145) Pulse Rate:  [40-129] 129 (02/08 1708) Resp:  [18-32] 32 (02/08 1708) BP: (79-127)/(51-115) 99/79 mmHg (02/08 1708) SpO2:  [93 %-100 %] 100 % (02/08 1708) FiO2 (%):  [30 %] 30 % (02/08 1708) Weight:  [104.8 kg (231 lb 0.7 oz)] 104.8 kg (231 lb 0.7 oz) (02/08 0500)  Intake/Output from previous day:    Physical Exam: Pt is awake but does not follow commands. HEENT: Trach in place Neck: JVP - unable to visualize Lungs: Coarse breath sounds bilaterally CV: Tachycardic and irregular with harsh 2/6 systolic ejection murmur at the left sternal border Abd: soft, NT Ediffuse edema   Lab Results:  Recent Labs  05/19/15 0600 05/23/2015 0600  WBC 15.9* 14.9*  HGB 9.4* 9.0*  PLT 137* 150    Recent Labs  05/19/15 0600 06/02/2015 0600  NA 143 144  K 3.8 3.9  CL 114* 114*  CO2 21* 24  GLUCOSE 156* 168*  BUN 32* 36*  CREATININE 0.93 0.98   No results for input(s): TROPONINI in the last 72 hours.  Invalid input(s): CK, MB  Tele:Atrial fibrillation with RVR  Assessment/Plan:  Atrial fibrillation with RVR: on IV amiodarone. Would continue. Continue oral cardizem for now. Add beta-blocker if continued high rates. Agree he is not a candidate for anticoagulation.   Tonny Bollman, M.D. 05/21/2015, 6:30 PM

## 2015-05-20 NOTE — Progress Notes (Signed)
Follow up - Trauma and Critical Care  Patient Details:    [name] is an 82 y.o. male.  Lines/tubes : PICC Triple Lumen 04/19/15 PICC Right Basilic 37 cm 0 cm (Active)  Indication for Insertion or Continuance of Line Prolonged intravenous therapies 06/04/2015  9:00 AM  Exposed Catheter (cm) 0 cm 05/07/2015  6:00 AM  Site Assessment Edematous;Intact;Clean;Dry 06/01/2015  9:00 AM  Lumen #1 Status Infusing 05/31/2015  9:00 AM  Lumen #2 Status Infusing 06/04/2015  9:00 AM  Lumen #3 Status Flushed;Saline locked 05/23/2015  9:00 AM  Dressing Type Transparent;Occlusive 06/08/2015  9:00 AM  Dressing Status Clean;Dry;Intact;Antimicrobial disc in place 05/30/2015  9:00 AM  Line Care Connections checked and tightened 05/25/2015  9:00 AM  Line Adjustment (NICU/IV Team Only) No 04/19/2015 12:57 PM  Dressing Intervention New dressing;Antimicrobial disc changed;Dressing changed 05/14/2015  4:30 PM  Dressing Change Due 05/21/15 05/22/2015  9:00 AM     Gastrostomy/Enterostomy Percutaneous endoscopic gastrostomy (PEG) 24 Fr. LUQ (Active)  Surrounding Skin Intact 05/14/2015  8:00 AM  Tube Status Patent 06/05/2015  8:00 AM  Drainage Appearance Tan 06/06/2015  8:00 AM  Dressing Status Clean;Dry;Intact 05/13/2015  8:00 AM  Dressing Intervention Dressing changed 05/18/2015  5:00 AM  Dressing Type Split gauze;Abdominal Binder 05/21/2015  8:00 AM  G Port Intake (mL) 50 ml 05/18/2015 12:00 AM  J Port Intake (mL) 50 ml 05/19/2015  5:00 AM  Output (mL) 250 mL 05/15/2015  5:00 AM     Urethral Catheter Taryn H Straight-tip (Active)  Indication for Insertion or Continuance of Catheter Other (comment) 05/25/2015  8:00 AM  Site Assessment Clean;Intact 05/13/2015  8:00 AM  Catheter Maintenance Bag below level of bladder;Catheter secured;Seal intact;Drainage bag/tubing not touching floor;Insertion date on drainage bag 05/19/2015  8:00 AM  Collection Container Standard drainage bag 06/04/2015  8:00 AM  Securement Method Securing device (Describe)  05/30/2015  8:00 AM  Urinary Catheter Interventions Unclamped 06/06/2015  8:00 AM  Output (mL) 130 mL 05/21/2015  8:00 AM    Microbiology/Sepsis markers: Results for orders placed or performed during the hospital encounter of 04/12/15  MRSA PCR Screening     Status: None   Collection Time: 04/12/15  7:04 AM  Result Value Ref Range Status   MRSA by PCR NEGATIVE NEGATIVE Final    Comment:        The GeneXpert MRSA Assay (FDA approved for NASAL specimens only), is one component of a comprehensive MRSA colonization surveillance program. It is not intended to diagnose MRSA infection nor to guide or monitor treatment for MRSA infections.   Culture, respiratory (NON-Expectorated)     Status: None   Collection Time: 04/20/15  8:39 AM  Result Value Ref Range Status   Specimen Description TRACHEAL ASPIRATE  Final   Special Requests Normal  Final   Gram Stain   Final    ABUNDANT WBC PRESENT,BOTH PMN AND MONONUCLEAR NO SQUAMOUS EPITHELIAL CELLS SEEN NO ORGANISMS SEEN Performed at [facility]    Culture   Final    NORMAL OROPHARYNGEAL FLORA Performed at [facility]    Report Status 04/22/2015 FINAL  Final  Culture, respiratory (NON-Expectorated)     Status: None   Collection Time: 04/23/15  2:45 PM  Result Value Ref Range Status   Specimen Description TRACHEAL ASPIRATE  Final   Special Requests NONE  Final   Gram Stain   Final    MODERATE WBC PRESENT,BOTH PMN AND MONONUCLEAR FEW SQUAMOUS EPITHELIAL CELLS PRESENT MODERATE GRAM POSITIVE  COCCI IN PAIRS IN CLUSTERS Performed at [facility]    Culture   Final    NORMAL OROPHARYNGEAL FLORA Performed at [facility]    Report Status 04/26/2015 FINAL  Final  Culture, bal-quantitative     Status: None   Collection Time: 04/23/15  3:43 PM  Result Value Ref Range Status   Specimen Description BRONCHIAL ALVEOLAR LAVAGE  Final   Special Requests NONE  Final   Gram Stain   Final    FEW WBC PRESENT,  PREDOMINANTLY MONONUCLEAR ABUNDANT SQUAMOUS EPITHELIAL CELLS PRESENT MODERATE GRAM POSITIVE COCCI IN PAIRS IN CLUSTERS Performed at [facility]   Final    >=100,000 COLONIES/ML Performed at [facility]    Culture   Final    NORMAL OROPHARYNGEAL FLORA Performed at [facility]    Report Status 04/26/2015 FINAL  Final  Culture, blood (Routine X 2) w Reflex to ID Panel     Status: None   Collection Time: 05/12/15 11:59 PM  Result Value Ref Range Status   Specimen Description BLOOD LEFT ARM  Final   Special Requests IN PEDIATRIC BOTTLE 0.5ML  Final   Culture  Setup Time   Final    GRAM POSITIVE COCCI IN CLUSTERS AEROBIC BOTTLE ONLY CRITICAL RESULT CALLED TO, READ BACK BY AND VERIFIED WITH: Silas Flood RN 1924 05/13/15 A BROWNING    Culture   Final    STAPHYLOCOCCUS SPECIES (COAGULASE NEGATIVE) THE SIGNIFICANCE OF ISOLATING THIS ORGANISM FROM A SINGLE SET OF BLOOD CULTURES WHEN MULTIPLE SETS ARE DRAWN IS UNCERTAIN. PLEASE NOTIFY THE MICROBIOLOGY DEPARTMENT WITHIN ONE WEEK IF SPECIATION AND SENSITIVITIES ARE REQUIRED.    Report Status 05/14/2015 FINAL  Final  Culture, blood (Routine X 2) w Reflex to ID Panel     Status: None   Collection Time: 05/13/15 12:08 AM  Result Value Ref Range Status   Specimen Description BLOOD LEFT ARM  Final   Special Requests IN PEDIATRIC BOTTLE  Final   Culture NO GROWTH 5 DAYS  Final   Report Status 05/18/2015 FINAL  Final  Culture, Urine     Status: None   Collection Time: 05/13/15  3:30 AM  Result Value Ref Range Status   Specimen Description URINE, CATHETERIZED  Final   Special Requests NONE  Final   Culture NO GROWTH 1 DAY  Final   Report Status 05/14/2015 FINAL  Final  Culture, respiratory (NON-Expectorated)     Status: None   Collection Time: 05/13/15  4:50 AM  Result Value Ref Range Status   Specimen Description TRACHEAL ASPIRATE  Final   Special Requests NONE  Final   Gram Stain   Final     ABUNDANT WBC PRESENT,BOTH PMN AND MONONUCLEAR NO SQUAMOUS EPITHELIAL CELLS SEEN ABUNDANT GRAM POSITIVE COCCI IN PAIRS IN CLUSTERS FEW GRAM NEGATIVE RODS Performed at [facility]    Culture   Final    ABUNDANT STAPHYLOCOCCUS AUREUS Note: RIFAMPIN AND GENTAMICIN SHOULD NOT BE USED AS SINGLE DRUGS FOR TREATMENT OF STAPH INFECTIONS. Performed at [facility]    Report Status 05/16/2015 FINAL  Final   Organism ID, Bacteria STAPHYLOCOCCUS AUREUS  Final      Susceptibility   Staphylococcus aureus - MIC*    CLINDAMYCIN <=0.25 SENSITIVE Sensitive     ERYTHROMYCIN <=0.25 SENSITIVE Sensitive     GENTAMICIN <=0.5 SENSITIVE Sensitive     LEVOFLOXACIN 0.25 SENSITIVE Sensitive     OXACILLIN 0.5 SENSITIVE Sensitive  RIFAMPIN <=0.5 SENSITIVE Sensitive     TRIMETH/SULFA <=10 SENSITIVE Sensitive     VANCOMYCIN <=0.5 SENSITIVE Sensitive     TETRACYCLINE <=1 SENSITIVE Sensitive     MOXIFLOXACIN <=0.25 SENSITIVE Sensitive     * ABUNDANT STAPHYLOCOCCUS AUREUS    Anti-infectives:  Anti-infectives    Start     Dose/Rate Route Frequency Ordered Stop   05/17/15 1200  clindamycin (CLEOCIN) 75 MG/5ML solution 300 mg     300 mg Per Tube 4 times per day 05/17/15 1026 06/06/2015 0540   05/15/15 1200  vancomycin (VANCOCIN) IVPB 750 mg/150 ml premix  Status:  Discontinued     750 mg 150 mL/hr over 60 Minutes Intravenous Every 12 hours 05/15/15 0742 05/17/15 1026   05/15/15 0900  cefTAZidime (FORTAZ) 1 g in dextrose 5 % 50 mL IVPB  Status:  Discontinued     1 g 100 mL/hr over 30 Minutes Intravenous Every 8 hours 05/15/15 0746 05/16/15 1001   05/12/15 2300  vancomycin (VANCOCIN) 1,250 mg in sodium chloride 0.9 % 250 mL IVPB  Status:  Discontinued     1,250 mg 166.7 mL/hr over 90 Minutes Intravenous Every 24 hours 05/12/15 2244 05/15/15 0742   05/12/15 2300  cefTAZidime (FORTAZ) 1 g in dextrose 5 % 50 mL IVPB  Status:  Discontinued     1 g 100 mL/hr over 30 Minutes Intravenous Every 12 hours  05/12/15 2246 05/15/15 0746   05/12/15 2245  ceFEPIme (MAXIPIME) 2 g in dextrose 5 % 50 mL IVPB  Status:  Discontinued     2 g 100 mL/hr over 30 Minutes Intravenous Every 12 hours 05/12/15 2243 05/12/15 2245   05/01/15 0416  ceFEPIme (MAXIPIME) 2 g in dextrose 5 % 50 mL IVPB     2 g 100 mL/hr over 30 Minutes Intravenous Every 24 hours 04/30/15 1352 05/02/15 0507   04/27/15 0400  ceFEPIme (MAXIPIME) 2 g in dextrose 5 % 50 mL IVPB  Status:  Discontinued     2 g 100 mL/hr over 30 Minutes Intravenous Every 24 hours 04/26/15 0814 04/26/15 0833   04/26/15 2000  vancomycin (VANCOCIN) IVPB 750 mg/150 ml premix  Status:  Discontinued     750 mg 150 mL/hr over 60 Minutes Intravenous Every 12 hours 04/26/15 0817 04/27/15 0900   04/26/15 1600  ceFEPIme (MAXIPIME) 2 g in dextrose 5 % 50 mL IVPB  Status:  Discontinued     2 g 100 mL/hr over 30 Minutes Intravenous Every 12 hours 04/26/15 0833 04/30/15 1352   04/24/15 2000  vancomycin (VANCOCIN) 1,250 mg in sodium chloride 0.9 % 250 mL IVPB  Status:  Discontinued     1,250 mg 166.7 mL/hr over 90 Minutes Intravenous Every 12 hours 04/24/15 0805 04/26/15 0817   04/24/15 1300  ceFEPIme (MAXIPIME) 2 g in dextrose 5 % 50 mL IVPB  Status:  Discontinued     2 g 100 mL/hr over 30 Minutes Intravenous Every 8 hours 04/24/15 0813 04/26/15 0814   04/23/15 1600  vancomycin (VANCOCIN) IVPB 750 mg/150 ml premix  Status:  Discontinued     750 mg 150 mL/hr over 60 Minutes Intravenous Every 12 hours 04/23/15 1513 04/24/15 0805   04/23/15 1530  ceFEPIme (MAXIPIME) 2 g in dextrose 5 % 50 mL IVPB  Status:  Discontinued     2 g 100 mL/hr over 30 Minutes Intravenous Every 12 hours 04/23/15 1504 04/24/15 0813   04/20/15 2200  ceFEPIme (MAXIPIME) 2 g in dextrose 5 % 50 mL  IVPB  Status:  Discontinued     2 g 100 mL/hr over 30 Minutes Intravenous Every 12 hours 04/20/15 1124 04/22/15 0847   04/14/15 2230  vancomycin (VANCOCIN) IVPB 750 mg/150 ml premix  Status:  Discontinued      750 mg 150 mL/hr over 60 Minutes Intravenous Every 12 hours 04/14/15 1508 04/15/15 1130   04/14/15 1338  vancomycin (VANCOCIN) 1000 MG powder  Status:  Discontinued    Comments:  Ratcliff, Esther   : cabinet override      04/14/15 1338 04/14/15 1343   04/14/15 1213  vancomycin (VANCOCIN) powder  Status:  Discontinued       As needed 04/14/15 1214 04/14/15 1333   04/14/15 1211  vancomycin (VANCOCIN) 1000 MG powder    Comments:  Lannie Fields, Esther   : cabinet override      04/14/15 1211 04/15/15 0014   04/14/15 1147  bacitracin 50,000 Units in sodium chloride irrigation 0.9 % 500 mL irrigation  Status:  Discontinued       As needed 04/14/15 1147 04/14/15 1333   04/14/15 0600  vancomycin (VANCOCIN) IVPB 1000 mg/200 mL premix     1,000 mg 200 mL/hr over 60 Minutes Intravenous To Neuro OR-Station #32 04/13/15 1602 04/14/15 1145   04/13/15 1100  ceFEPIme (MAXIPIME) 2 g in dextrose 5 % 50 mL IVPB  Status:  Discontinued     2 g 100 mL/hr over 30 Minutes Intravenous Every 24 hours 04/13/15 1017 04/20/15 1124   04/12/15 0445  ceFAZolin (ANCEF) IVPB 1 g/50 mL premix     1 g 100 mL/hr over 30 Minutes Intravenous  Once 04/12/15 0430 04/12/15 0535      Best Practice/Protocols:  VTE Prophylaxis: Lovenox (prophylaxtic dose) and Mechanical GI Prophylaxis: Proton Pump Inhibitor Continous Sedation Fentanyl only  Consults: Treatment Team:  Rounding Lbcardiology, MD    Events:  Subjective:    Overnight Issues: Has started to wean.  No big issues overnight  Objective:  Vital signs for last 24 hours: Temp:  [97.4 F (36.3 C)-98.8 F (37.1 C)] 98.8 F (37.1 C) (02/08 0400) Pulse Rate:  [40-123] 60 (02/08 1100) Resp:  [19-30] 22 (02/08 1100) BP: (79-127)/(51-115) 107/71 mmHg (02/08 1100) SpO2:  [93 %-100 %] 100 % (02/08 1100) FiO2 (%):  [30 %] 30 % (02/08 0911) Weight:  [104.8 kg (231 lb 0.7 oz)] 104.8 kg (231 lb 0.7 oz) (02/08 0500)  Hemodynamic parameters for last 24 hours:     Intake/Output from previous day: 02/07 0701 - 02/08 0700 In: 2938.2 [I.V.:1013.2; NG/GT:1925] Out: 2425 [Urine:2425]  Intake/Output this shift: Total I/O In: 496.8 [I.V.:196.8; NG/GT:300] Out: 130 [Urine:130]  Vent settings for last 24 hours: Vent Mode:  [-] PSV;CPAP FiO2 (%):  [30 %] 30 % Set Rate:  [20 bmp] 20 bmp Vt Set:  [590 mL] 590 mL PEEP:  [5 cmH20] 5 cmH20 Pressure Support:  [5 cmH20] 5 cmH20 Plateau Pressure:  [15 cmH20-16 cmH20] 15 cmH20  Physical Exam:  General: no respiratory distress Neuro: nonfocal exam and RASS -1 Resp: clear to auscultation bilaterally and Weaning on the ventilator CVS: IRR and atrial fibrillation with RVR GI: soft, nontender, BS WNL, no r/g and less distended with good bowel sounds. Extremities: edema 1+ and edema 2+  Results for orders placed or performed during the hospital encounter of 04/12/15 (from the past 24 hour(s))  Glucose, capillary     Status: Abnormal   Collection Time: 05/19/15 11:31 AM  Result Value Ref Range   Glucose-Capillary  195 (H) 65 - 99 mg/dL  Glucose, capillary     Status: Abnormal   Collection Time: 05/19/15  3:40 PM  Result Value Ref Range   Glucose-Capillary 180 (H) 65 - 99 mg/dL   Comment 1 Notify RN    Comment 2 Document in Chart   Glucose, capillary     Status: Abnormal   Collection Time: 05/19/15  7:46 PM  Result Value Ref Range   Glucose-Capillary 185 (H) 65 - 99 mg/dL  Magnesium     Status: Abnormal   Collection Time: 05/19/15 10:25 PM  Result Value Ref Range   Magnesium 1.6 (L) 1.7 - 2.4 mg/dL  Glucose, capillary     Status: Abnormal   Collection Time: 05/19/15 11:22 PM  Result Value Ref Range   Glucose-Capillary 168 (H) 65 - 99 mg/dL  Glucose, capillary     Status: Abnormal   Collection Time: 06/19/2015  3:22 AM  Result Value Ref Range   Glucose-Capillary 163 (H) 65 - 99 mg/dL  CBC with Differential/Platelet     Status: Abnormal   Collection Time: 19-Jun-2015  6:00 AM  Result Value Ref Range    WBC 14.9 (H) 4.0 - 10.5 K/uL   RBC 3.25 (L) 4.22 - 5.81 MIL/uL   Hemoglobin 9.0 (L) 13.0 - 17.0 g/dL   HCT 16.1 (L) 09.6 - 04.5 %   MCV 92.6 78.0 - 100.0 fL   MCH 27.7 26.0 - 34.0 pg   MCHC 29.9 (L) 30.0 - 36.0 g/dL   RDW 40.9 (H) 81.1 - 91.4 %   Platelets 150 150 - 400 K/uL   Neutrophils Relative % 88 %   Neutro Abs 13.1 (H) 1.7 - 7.7 K/uL   Lymphocytes Relative 8 %   Lymphs Abs 1.2 0.7 - 4.0 K/uL   Monocytes Relative 4 %   Monocytes Absolute 0.6 0.1 - 1.0 K/uL   Eosinophils Relative 0 %   Eosinophils Absolute 0.0 0.0 - 0.7 K/uL   Basophils Relative 0 %   Basophils Absolute 0.0 0.0 - 0.1 K/uL  Basic metabolic panel     Status: Abnormal   Collection Time: 06-19-15  6:00 AM  Result Value Ref Range   Sodium 144 135 - 145 mmol/L   Potassium 3.9 3.5 - 5.1 mmol/L   Chloride 114 (H) 101 - 111 mmol/L   CO2 24 22 - 32 mmol/L   Glucose, Bld 168 (H) 65 - 99 mg/dL   BUN 36 (H) 6 - 20 mg/dL   Creatinine, Ser 7.82 0.61 - 1.24 mg/dL   Calcium 8.5 (L) 8.9 - 10.3 mg/dL   GFR calc non Af Amer >60 >60 mL/min   GFR calc Af Amer >60 >60 mL/min   Anion gap 6 5 - 15  Glucose, capillary     Status: Abnormal   Collection Time: 06/19/2015  8:18 AM  Result Value Ref Range   Glucose-Capillary 153 (H) 65 - 99 mg/dL     Assessment/Plan:   NEURO  Altered Mental Status:  sedation   Plan: Wean sedation as tolerated  PULM  Atelectasis/collapse (focal and Bilateral atelectasis)   Plan: Continue support, but can wean to trach collar as the patient is weaning very well on PS/PEEP  CARDIO  Atrial Fibrillation (with rapid ventricular response and has just converted to Aflutter with rate 90)   Plan: Cardiology consultation  RENAL  Urine output has been good.  Will Still diurese with Lasix per G-tube   Plan: Lasix  GI  No specific  issues   Plan: CPM  ID  Pneumonia (hospital acquired (not ventilator-associated) Staph)   Plan: Being treated.  HEME  Anemia acute blood loss anemia and anemia of  critical illness)   Plan: Stable  ENDO No specific problems   Plan: CPM  Global Issues  Weaning to trach collar.  Still in Afib/flutter with RVR.  BP a bit low now.  Appreciate Cardiology assistance    LOS: 38 days   Additional comments:I reviewed the patient's new clinical lab test results. cbc/bmet and I reviewed the patients new imaging test results. cxr  Critical Care Total Time*: 30 Minutes  Daire Okimoto 05/28/2015  *Care during the described time interval was provided by me and/or other providers on the critical care team.  I have reviewed this patient's available data, including medical history, events of note, physical examination and test results as part of my evaluation.

## 2015-05-20 NOTE — Clinical Social Work Note (Signed)
Clinical Social Worker continuing to follow patient and family for support and discharge planning needs.  Patient has been accepted to [facility] and plans to transfer today.  Clinical Social Worker will sign off for now as social work intervention is no longer needed. Please consult [place] again if new need arises.  Macario Golds, [place] [phone]
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
## 2015-05-21 ENCOUNTER — Other Ambulatory Visit (HOSPITAL_COMMUNITY): Payer: Self-pay

## 2015-05-21 DIAGNOSIS — J810 Acute pulmonary edema: Secondary | ICD-10-CM | POA: Diagnosis not present

## 2015-05-21 DIAGNOSIS — J9621 Acute and chronic respiratory failure with hypoxia: Secondary | ICD-10-CM | POA: Diagnosis not present

## 2015-05-21 DIAGNOSIS — I480 Paroxysmal atrial fibrillation: Secondary | ICD-10-CM

## 2015-05-21 DIAGNOSIS — I4891 Unspecified atrial fibrillation: Secondary | ICD-10-CM

## 2015-05-21 DIAGNOSIS — Z930 Tracheostomy status: Secondary | ICD-10-CM

## 2015-05-21 LAB — CBC WITH DIFFERENTIAL/PLATELET
Basophils Absolute: 0 10*3/uL (ref 0.0–0.1)
Basophils Relative: 0 %
EOS ABS: 0 10*3/uL (ref 0.0–0.7)
Eosinophils Relative: 0 %
HCT: 31.4 % — ABNORMAL LOW (ref 39.0–52.0)
HEMOGLOBIN: 9.9 g/dL — AB (ref 13.0–17.0)
LYMPHS ABS: 0.7 10*3/uL (ref 0.7–4.0)
LYMPHS PCT: 4 %
MCH: 28.9 pg (ref 26.0–34.0)
MCHC: 31.5 g/dL (ref 30.0–36.0)
MCV: 91.8 fL (ref 78.0–100.0)
Monocytes Absolute: 0.5 10*3/uL (ref 0.1–1.0)
Monocytes Relative: 3 %
NEUTROS PCT: 93 %
Neutro Abs: 16.7 10*3/uL — ABNORMAL HIGH (ref 1.7–7.7)
Platelets: 171 10*3/uL (ref 150–400)
RBC: 3.42 MIL/uL — AB (ref 4.22–5.81)
RDW: 17.1 % — ABNORMAL HIGH (ref 11.5–15.5)
WBC: 17.9 10*3/uL — AB (ref 4.0–10.5)

## 2015-05-21 LAB — COMPREHENSIVE METABOLIC PANEL
ALBUMIN: 1.8 g/dL — AB (ref 3.5–5.0)
ALT: 26 U/L (ref 17–63)
AST: 23 U/L (ref 15–41)
Alkaline Phosphatase: 72 U/L (ref 38–126)
Anion gap: 10 (ref 5–15)
BILIRUBIN TOTAL: 0.3 mg/dL (ref 0.3–1.2)
BUN: 39 mg/dL — AB (ref 6–20)
CO2: 24 mmol/L (ref 22–32)
CREATININE: 0.99 mg/dL (ref 0.61–1.24)
Calcium: 8.2 mg/dL — ABNORMAL LOW (ref 8.9–10.3)
Chloride: 107 mmol/L (ref 101–111)
GFR calc Af Amer: >60 mL/min (ref >60)
GLUCOSE: 163 mg/dL — AB (ref 65–99)
POTASSIUM: 3.8 mmol/L (ref 3.5–5.1)
Sodium: 141 mmol/L (ref 135–145)
TOTAL PROTEIN: 4.7 g/dL — AB (ref 6.5–8.1)

## 2015-05-21 LAB — URINALYSIS, ROUTINE W REFLEX MICROSCOPIC
Bilirubin Urine: NEGATIVE
Glucose, UA: NEGATIVE mg/dL
Ketones, ur: NEGATIVE mg/dL
Nitrite: NEGATIVE
Protein, ur: NEGATIVE mg/dL
SPECIFIC GRAVITY, URINE: 1.021 (ref 1.005–1.030)
pH: 5.5 (ref 5.0–8.0)

## 2015-05-21 LAB — TSH: TSH: 0.395 u[IU]/mL (ref 0.350–4.500)

## 2015-05-21 LAB — URINE MICROSCOPIC-ADD ON: SQUAMOUS EPITHELIAL / LPF: NONE SEEN
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
## 2015-05-22 LAB — BASIC METABOLIC PANEL
Anion gap: 9 (ref 5–15)
BUN: 37 mg/dL — AB (ref 6–20)
CHLORIDE: 104 mmol/L (ref 101–111)
CO2: 29 mmol/L (ref 22–32)
CREATININE: 0.9 mg/dL (ref 0.61–1.24)
Calcium: 8 mg/dL — ABNORMAL LOW (ref 8.9–10.3)
GFR calc Af Amer: >60 mL/min (ref >60)
GFR calc non Af Amer: >60 mL/min (ref >60)
Glucose, Bld: 184 mg/dL — ABNORMAL HIGH (ref 65–99)
Potassium: 2.7 mmol/L — CL (ref 3.5–5.1)
SODIUM: 142 mmol/L (ref 135–145)

## 2015-05-22 LAB — MAGNESIUM: MAGNESIUM: 1.7 mg/dL (ref 1.7–2.4)

## 2015-05-22 LAB — CBC WITH DIFFERENTIAL/PLATELET
Basophils Absolute: 0 10*3/uL (ref 0.0–0.1)
Basophils Relative: 0 %
EOS PCT: 0 %
Eosinophils Absolute: 0 10*3/uL (ref 0.0–0.7)
HCT: 29 % — ABNORMAL LOW (ref 39.0–52.0)
Hemoglobin: 9.1 g/dL — ABNORMAL LOW (ref 13.0–17.0)
LYMPHS ABS: 0.6 10*3/uL — AB (ref 0.7–4.0)
LYMPHS PCT: 4 %
MCH: 28.1 pg (ref 26.0–34.0)
MCHC: 31.4 g/dL (ref 30.0–36.0)
MCV: 89.5 fL (ref 78.0–100.0)
MONO ABS: 0.7 10*3/uL (ref 0.1–1.0)
Monocytes Relative: 4 %
Neutro Abs: 15.9 10*3/uL — ABNORMAL HIGH (ref 1.7–7.7)
Neutrophils Relative %: 92 %
PLATELETS: 197 10*3/uL (ref 150–400)
RBC: 3.24 MIL/uL — ABNORMAL LOW (ref 4.22–5.81)
RDW: 16.6 % — AB (ref 11.5–15.5)
WBC: 17.2 10*3/uL — ABNORMAL HIGH (ref 4.0–10.5)

## 2015-05-22 LAB — URINE CULTURE: Culture: >=100000

## 2015-05-22 NOTE — Progress Notes (Signed)
    Subjective:  No history obtainable  Objective:  Vital Signs in the last 24 hours:    Intake/Output from previous day:    Physical Exam: Pt is asleep, trach in place, no distress, nonresponsive HEENT: normal Neck: JVP -unable to visualize Lungs: CTA bilaterally CV: RRR with 3/6 harsh systolic murmur at the LSB Abd: soft Ext: anasarca present  Lab Results:  Recent Labs  05/21/15 0846 05/22/15 0745  WBC 17.9* 17.2*  HGB 9.9* 9.1*  PLT 171 197    Recent Labs  05/21/15 0846 05/22/15 0745  NA 141 142  K 3.8 2.7*  CL 107 104  CO2 24 29  GLUCOSE 163* 184*  BUN 39* 37*  CREATININE 0.99 0.90   No results for input(s): TROPONINI in the last 72 hours.  Invalid input(s): CK, MB  Cardiac Studies: Echo 04/13/15: Study Conclusions  - Procedure narrative: Transthoracic echocardiography. Image quality was poor. The study was technically difficult, as a result of restricted patient mobility. - Left ventricle: The cavity size was normal. There was moderate concentric hypertrophy. Systolic function was normal. The estimated ejection fraction was in the range of 60% to 65%. Images were inadequate for LV wall motion assessment. The study is not technically sufficient to allow evaluation of LV diastolic function. - Aortic valve: Moderately calcified with severe aortic stenosis. AVA around 0.9-1 cm2. There was no significant regurgitation. Mean gradient (S): 33 mm Hg. Peak gradient (S): 53 mm Hg. Valve area (Vmax): 1.1 cm^2. Valve area (Vmean): 0.93 cm^2. - Mitral valve: Calcified annulus. There was trivial regurgitation. - Left atrium: The atrium was normal in size. - Inferior vena cava: The vessel was normal in size. The respirophasic diameter changes were in the normal range (>= 50%), consistent with normal central venous pressure.  Impressions:  - Technically difficult study. LVEF 60-65%, moderate LVH, severe aortic stenosis AVA around  0.9-1 cm2, mean gradient of 33 mmHg.   Tele: Personally reviewed: in sinus rhythm  Assessment/Plan:  1. Atrial fib/flutter: in sinus on amiodarone and cardizem. Medications reviewed. Not a candidate for anticoagulation.  2. Severe AS: echo from January reviewed.   3. Volume overload: diuresis per primary team.   Appears stable from cardiac perspective. Primary issue is severe anoxic brain injury. Please call if we can be of any further assistance.   Tonny Bollman, M.D. 05/22/2015, 5:27 PM

## 2015-05-23 LAB — BASIC METABOLIC PANEL
Anion gap: 11 (ref 5–15)
BUN: 37 mg/dL — ABNORMAL HIGH (ref 6–20)
CHLORIDE: 99 mmol/L — AB (ref 101–111)
CO2: 32 mmol/L (ref 22–32)
Calcium: 7.9 mg/dL — ABNORMAL LOW (ref 8.9–10.3)
Creatinine, Ser: 0.95 mg/dL (ref 0.61–1.24)
Glucose, Bld: 214 mg/dL — ABNORMAL HIGH (ref 65–99)
POTASSIUM: 3 mmol/L — AB (ref 3.5–5.1)
Sodium: 142 mmol/L (ref 135–145)

## 2015-05-23 LAB — MAGNESIUM: MAGNESIUM: 1.7 mg/dL (ref 1.7–2.4)

## 2015-05-23 MED FILL — Medication: Qty: 1 | Status: AC

## 2015-05-24 LAB — BASIC METABOLIC PANEL
ANION GAP: 13 (ref 5–15)
BUN: 39 mg/dL — AB (ref 6–20)
CALCIUM: 8.1 mg/dL — AB (ref 8.9–10.3)
CO2: 32 mmol/L (ref 22–32)
Chloride: 98 mmol/L — ABNORMAL LOW (ref 101–111)
Creatinine, Ser: 1.01 mg/dL (ref 0.61–1.24)
GFR calc Af Amer: >60 mL/min (ref >60)
Glucose, Bld: 174 mg/dL — ABNORMAL HIGH (ref 65–99)
POTASSIUM: 2.8 mmol/L — AB (ref 3.5–5.1)
SODIUM: 143 mmol/L (ref 135–145)

## 2015-05-24 LAB — MAGNESIUM: MAGNESIUM: 1.7 mg/dL (ref 1.7–2.4)

## 2015-05-25 LAB — POTASSIUM
Potassium: 2.6 mmol/L — CL (ref 3.5–5.1)
Potassium: 3 mmol/L — ABNORMAL LOW (ref 3.5–5.1)
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
## 2015-05-27 LAB — POTASSIUM: POTASSIUM: 3.1 mmol/L — AB (ref 3.5–5.1)

## 2015-05-28 DIAGNOSIS — J9620 Acute and chronic respiratory failure, unspecified whether with hypoxia or hypercapnia: Secondary | ICD-10-CM

## 2015-05-28 DIAGNOSIS — Z930 Tracheostomy status: Secondary | ICD-10-CM

## 2015-05-28 LAB — POTASSIUM: Potassium: 2.9 mmol/L — ABNORMAL LOW (ref 3.5–5.1)
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
## 2015-05-29 LAB — POTASSIUM: Potassium: 3.4 mmol/L — ABNORMAL LOW (ref 3.5–5.1)

## 2015-05-31 LAB — BASIC METABOLIC PANEL
ANION GAP: 11 (ref 5–15)
BUN: 28 mg/dL — ABNORMAL HIGH (ref 6–20)
CALCIUM: 8.5 mg/dL — AB (ref 8.9–10.3)
CO2: 33 mmol/L — ABNORMAL HIGH (ref 22–32)
CREATININE: 0.9 mg/dL (ref 0.61–1.24)
Chloride: 92 mmol/L — ABNORMAL LOW (ref 101–111)
Glucose, Bld: 104 mg/dL — ABNORMAL HIGH (ref 65–99)
Potassium: 4.1 mmol/L (ref 3.5–5.1)
Sodium: 136 mmol/L (ref 135–145)

## 2015-05-31 LAB — CBC
HCT: 33.6 % — ABNORMAL LOW (ref 39.0–52.0)
Hemoglobin: 10.5 g/dL — ABNORMAL LOW (ref 13.0–17.0)
MCH: 29 pg (ref 26.0–34.0)
MCHC: 31.3 g/dL (ref 30.0–36.0)
MCV: 92.8 fL (ref 78.0–100.0)
PLATELETS: 228 10*3/uL (ref 150–400)
RBC: 3.62 MIL/uL — ABNORMAL LOW (ref 4.22–5.81)
RDW: 17.3 % — AB (ref 11.5–15.5)
WBC: 16.2 10*3/uL — AB (ref 4.0–10.5)

## 2015-06-01 DIAGNOSIS — J9620 Acute and chronic respiratory failure, unspecified whether with hypoxia or hypercapnia: Secondary | ICD-10-CM | POA: Diagnosis not present

## 2015-06-01 LAB — BLOOD GAS, ARTERIAL
ACID-BASE EXCESS: 13.2 mmol/L — AB (ref 0.0–2.0)
Bicarbonate: 37.8 mEq/L — ABNORMAL HIGH (ref 20.0–24.0)
Drawn by: 270521
FIO2: 0.35
O2 Saturation: 91 %
PCO2 ART: 53 mmHg — AB (ref 35.0–45.0)
PO2 ART: 53 mmHg — AB (ref 80.0–100.0)
Patient temperature: 98.6
TCO2: 39.4 mmol/L (ref 0–100)
pH, Arterial: 7.466 — ABNORMAL HIGH (ref 7.350–7.450)
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
## 2015-06-03 LAB — BASIC METABOLIC PANEL
Anion gap: 16 — ABNORMAL HIGH (ref 5–15)
BUN: 38 mg/dL — AB (ref 6–20)
CO2: 31 mmol/L (ref 22–32)
CREATININE: 1.23 mg/dL (ref 0.61–1.24)
Calcium: 9.3 mg/dL (ref 8.9–10.3)
Chloride: 91 mmol/L — ABNORMAL LOW (ref 101–111)
GFR, EST NON AFRICAN AMERICAN: 54 mL/min — AB (ref >60)
Glucose, Bld: 142 mg/dL — ABNORMAL HIGH (ref 65–99)
Potassium: 4.2 mmol/L (ref 3.5–5.1)
SODIUM: 138 mmol/L (ref 135–145)

## 2015-06-03 LAB — CBC
HCT: 37.8 % — ABNORMAL LOW (ref 39.0–52.0)
HEMOGLOBIN: 11.6 g/dL — AB (ref 13.0–17.0)
MCH: 28.2 pg (ref 26.0–34.0)
MCHC: 30.7 g/dL (ref 30.0–36.0)
MCV: 91.7 fL (ref 78.0–100.0)
PLATELETS: 277 10*3/uL (ref 150–400)
RBC: 4.12 MIL/uL — AB (ref 4.22–5.81)
RDW: 17.3 % — ABNORMAL HIGH (ref 11.5–15.5)
WBC: 13.4 10*3/uL — ABNORMAL HIGH (ref 4.0–10.5)

## 2015-06-04 ENCOUNTER — Other Ambulatory Visit (HOSPITAL_COMMUNITY): Payer: Self-pay

## 2015-06-04 DIAGNOSIS — J9600 Acute respiratory failure, unspecified whether with hypoxia or hypercapnia: Secondary | ICD-10-CM

## 2015-06-04 LAB — BASIC METABOLIC PANEL
Anion gap: 15 (ref 5–15)
BUN: 50 mg/dL — AB (ref 6–20)
CHLORIDE: 94 mmol/L — AB (ref 101–111)
CO2: 29 mmol/L (ref 22–32)
CREATININE: 1.31 mg/dL — AB (ref 0.61–1.24)
Calcium: 9.3 mg/dL (ref 8.9–10.3)
GFR calc Af Amer: 58 mL/min — ABNORMAL LOW (ref >60)
GFR calc non Af Amer: 50 mL/min — ABNORMAL LOW (ref >60)
Glucose, Bld: 201 mg/dL — ABNORMAL HIGH (ref 65–99)
Potassium: 4.5 mmol/L (ref 3.5–5.1)
Sodium: 138 mmol/L (ref 135–145)

## 2015-06-04 LAB — BLOOD GAS, ARTERIAL
Acid-Base Excess: 10.2 mmol/L — ABNORMAL HIGH (ref 0.0–2.0)
BICARBONATE: 34.1 meq/L — AB (ref 20.0–24.0)
FIO2: 0.8
LHR: 12 {breaths}/min
O2 SAT: 93.4 %
PCO2 ART: 44 mmHg (ref 35.0–45.0)
PH ART: 7.5 — AB (ref 7.350–7.450)
PO2 ART: 67.1 mmHg — AB (ref 80.0–100.0)
Patient temperature: 98.6
TCO2: 35.4 mmol/L (ref 0–100)
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
## 2015-06-05 LAB — CBC
HCT: 37.2 % — ABNORMAL LOW (ref 39.0–52.0)
HEMOGLOBIN: 11.4 g/dL — AB (ref 13.0–17.0)
MCH: 27.9 pg (ref 26.0–34.0)
MCHC: 30.6 g/dL (ref 30.0–36.0)
MCV: 91.2 fL (ref 78.0–100.0)
PLATELETS: 285 10*3/uL (ref 150–400)
RBC: 4.08 MIL/uL — AB (ref 4.22–5.81)
RDW: 17.2 % — ABNORMAL HIGH (ref 11.5–15.5)
WBC: 10.9 10*3/uL — ABNORMAL HIGH (ref 4.0–10.5)

## 2015-06-08 DIAGNOSIS — J9620 Acute and chronic respiratory failure, unspecified whether with hypoxia or hypercapnia: Secondary | ICD-10-CM | POA: Diagnosis not present

## 2015-06-08 DIAGNOSIS — Z930 Tracheostomy status: Secondary | ICD-10-CM | POA: Diagnosis not present

## 2015-06-08 DIAGNOSIS — R5381 Other malaise: Secondary | ICD-10-CM | POA: Diagnosis not present
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
## 2015-06-10 DEATH — deceased

## 2015-06-11 DIAGNOSIS — J9620 Acute and chronic respiratory failure, unspecified whether with hypoxia or hypercapnia: Secondary | ICD-10-CM | POA: Diagnosis not present

## 2015-06-11 DIAGNOSIS — R5381 Other malaise: Secondary | ICD-10-CM | POA: Diagnosis not present

## 2015-06-11 DIAGNOSIS — Z930 Tracheostomy status: Secondary | ICD-10-CM | POA: Diagnosis not present

## 2015-06-11 DIAGNOSIS — E440 Moderate protein-calorie malnutrition: Secondary | ICD-10-CM
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
## 2015-06-14 ENCOUNTER — Other Ambulatory Visit (HOSPITAL_COMMUNITY): Payer: Self-pay

## 2015-06-14 LAB — CBC
HCT: 50.8 % (ref 39.0–52.0)
HEMATOCRIT: 49.8 % (ref 39.0–52.0)
HEMOGLOBIN: 14.7 g/dL (ref 13.0–17.0)
Hemoglobin: 15 g/dL (ref 13.0–17.0)
MCH: 28.5 pg (ref 26.0–34.0)
MCH: 28.7 pg (ref 26.0–34.0)
MCHC: 29.5 g/dL — ABNORMAL LOW (ref 30.0–36.0)
MCHC: 29.5 g/dL — ABNORMAL LOW (ref 30.0–36.0)
MCV: 96.7 fL (ref 78.0–100.0)
MCV: 97.3 fL (ref 78.0–100.0)
PLATELETS: 281 10*3/uL (ref 150–400)
Platelets: 292 10*3/uL (ref 150–400)
RBC: 5.15 MIL/uL (ref 4.22–5.81)
RBC: 5.22 MIL/uL (ref 4.22–5.81)
RDW: 19.3 % — AB (ref 11.5–15.5)
RDW: 19.4 % — AB (ref 11.5–15.5)
WBC: 32.6 10*3/uL — AB (ref 4.0–10.5)
WBC: 34.5 10*3/uL — AB (ref 4.0–10.5)

## 2015-06-14 LAB — BASIC METABOLIC PANEL
ANION GAP: 16 — AB (ref 5–15)
BUN: 192 mg/dL — ABNORMAL HIGH (ref 6–20)
CHLORIDE: 121 mmol/L — AB (ref 101–111)
CO2: 27 mmol/L (ref 22–32)
Calcium: 10.9 mg/dL — ABNORMAL HIGH (ref 8.9–10.3)
Creatinine, Ser: 3.03 mg/dL — ABNORMAL HIGH (ref 0.61–1.24)
GFR calc non Af Amer: 18 mL/min — ABNORMAL LOW (ref >60)
GFR, EST AFRICAN AMERICAN: 21 mL/min — AB (ref >60)
GLUCOSE: 297 mg/dL — AB (ref 65–99)
POTASSIUM: 5.4 mmol/L — AB (ref 3.5–5.1)
Sodium: 164 mmol/L (ref 135–145)

## 2015-06-14 LAB — URINALYSIS, ROUTINE W REFLEX MICROSCOPIC
BILIRUBIN URINE: NEGATIVE
Glucose, UA: NEGATIVE mg/dL
KETONES UR: NEGATIVE mg/dL
NITRITE: NEGATIVE
PROTEIN: 30 mg/dL — AB
Specific Gravity, Urine: 1.018 (ref 1.005–1.030)
pH: 6 (ref 5.0–8.0)

## 2015-06-14 LAB — URINE MICROSCOPIC-ADD ON

## 2015-06-14 LAB — MAGNESIUM: Magnesium: 3.7 mg/dL — ABNORMAL HIGH (ref 1.7–2.4)

## 2015-06-14 LAB — ALKALINE PHOSPHATASE: ALK PHOS: 88 U/L (ref 38–126)

## 2015-06-14 LAB — LACTIC ACID, PLASMA: LACTIC ACID, VENOUS: 2.3 mmol/L — AB (ref 0.5–2.0)

## 2015-06-15 ENCOUNTER — Other Ambulatory Visit (HOSPITAL_COMMUNITY): Payer: Self-pay

## 2015-06-15 DIAGNOSIS — Z930 Tracheostomy status: Secondary | ICD-10-CM | POA: Diagnosis not present

## 2015-06-15 DIAGNOSIS — J9620 Acute and chronic respiratory failure, unspecified whether with hypoxia or hypercapnia: Secondary | ICD-10-CM | POA: Diagnosis not present

## 2015-06-15 LAB — CBC
HEMATOCRIT: 49.4 % (ref 39.0–52.0)
Hemoglobin: 14.5 g/dL (ref 13.0–17.0)
MCH: 27.7 pg (ref 26.0–34.0)
MCHC: 29.4 g/dL — AB (ref 30.0–36.0)
MCV: 94.5 fL (ref 78.0–100.0)
Platelets: 283 10*3/uL (ref 150–400)
RBC: 5.23 MIL/uL (ref 4.22–5.81)
RDW: 19.4 % — AB (ref 11.5–15.5)
WBC: 33.3 10*3/uL — ABNORMAL HIGH (ref 4.0–10.5)

## 2015-06-15 LAB — BASIC METABOLIC PANEL
ANION GAP: 15 (ref 5–15)
Anion gap: 15 (ref 5–15)
BUN: 212 mg/dL — AB (ref 6–20)
BUN: 216 mg/dL — ABNORMAL HIGH (ref 6–20)
CALCIUM: 10.3 mg/dL (ref 8.9–10.3)
CHLORIDE: 123 mmol/L — AB (ref 101–111)
CO2: 25 mmol/L (ref 22–32)
CO2: 24 mmol/L (ref 22–32)
CREATININE: 2.97 mg/dL — AB (ref 0.61–1.24)
Calcium: 10 mg/dL (ref 8.9–10.3)
Chloride: 123 mmol/L — ABNORMAL HIGH (ref 101–111)
Creatinine, Ser: 3.07 mg/dL — ABNORMAL HIGH (ref 0.61–1.24)
GFR calc non Af Amer: 19 mL/min — ABNORMAL LOW (ref >60)
GFR, EST AFRICAN AMERICAN: 22 mL/min — AB (ref >60)
GFR, EST AFRICAN AMERICAN: 21 mL/min — AB (ref >60)
GFR, EST NON AFRICAN AMERICAN: 18 mL/min — AB (ref >60)
GLUCOSE: 310 mg/dL — AB (ref 65–99)
Glucose, Bld: 306 mg/dL — ABNORMAL HIGH (ref 65–99)
POTASSIUM: 5.4 mmol/L — AB (ref 3.5–5.1)
Potassium: 5.8 mmol/L — ABNORMAL HIGH (ref 3.5–5.1)
SODIUM: 162 mmol/L — AB (ref 135–145)
Sodium: 163 mmol/L (ref 135–145)

## 2015-06-15 LAB — BLOOD GAS, ARTERIAL
Acid-Base Excess: 1.3 mmol/L (ref 0.0–2.0)
Bicarbonate: 24.1 mEq/L — ABNORMAL HIGH (ref 20.0–24.0)
FIO2: 0.28
O2 SAT: 94.7 %
PH ART: 7.51 — AB (ref 7.350–7.450)
Patient temperature: 98.6
TCO2: 25.1 mmol/L (ref 0–100)
pCO2 arterial: 30.5 mmHg — ABNORMAL LOW (ref 35.0–45.0)
pO2, Arterial: 71.4 mmHg — ABNORMAL LOW (ref 80.0–100.0)

## 2015-06-15 LAB — URINE CULTURE: Culture: >=100000

## 2015-06-15 LAB — PHOSPHORUS: PHOSPHORUS: 3.9 mg/dL (ref 2.5–4.6)

## 2015-06-15 LAB — MAGNESIUM: Magnesium: 3.5 mg/dL — ABNORMAL HIGH (ref 1.7–2.4)
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
## 2015-06-16 ENCOUNTER — Other Ambulatory Visit (HOSPITAL_COMMUNITY): Payer: Self-pay

## 2015-06-16 LAB — CBC
HEMATOCRIT: 46.7 % (ref 39.0–52.0)
Hemoglobin: 13.7 g/dL (ref 13.0–17.0)
MCH: 28 pg (ref 26.0–34.0)
MCHC: 29.3 g/dL — AB (ref 30.0–36.0)
MCV: 95.5 fL (ref 78.0–100.0)
Platelets: 248 10*3/uL (ref 150–400)
RBC: 4.89 MIL/uL (ref 4.22–5.81)
RDW: 19.5 % — AB (ref 11.5–15.5)
WBC: 34.7 10*3/uL — ABNORMAL HIGH (ref 4.0–10.5)

## 2015-06-16 LAB — BASIC METABOLIC PANEL
Anion gap: 13 (ref 5–15)
BUN: 218 mg/dL — AB (ref 6–20)
CHLORIDE: 122 mmol/L — AB (ref 101–111)
CO2: 23 mmol/L (ref 22–32)
Calcium: 9.8 mg/dL (ref 8.9–10.3)
Creatinine, Ser: 3.42 mg/dL — ABNORMAL HIGH (ref 0.61–1.24)
GFR calc Af Amer: 18 mL/min — ABNORMAL LOW (ref >60)
GFR calc non Af Amer: 16 mL/min — ABNORMAL LOW (ref >60)
GLUCOSE: 297 mg/dL — AB (ref 65–99)
POTASSIUM: 5.4 mmol/L — AB (ref 3.5–5.1)
Sodium: 158 mmol/L — ABNORMAL HIGH (ref 135–145)

## 2015-06-16 LAB — MAGNESIUM: Magnesium: 3.5 mg/dL — ABNORMAL HIGH (ref 1.7–2.4)

## 2015-06-16 LAB — PHOSPHORUS: Phosphorus: 3.6 mg/dL (ref 2.5–4.6)

## 2015-06-17 LAB — C DIFFICILE QUICK SCREEN W PCR REFLEX
C DIFFICILE (CDIFF) TOXIN: NEGATIVE
C DIFFICLE (CDIFF) ANTIGEN: POSITIVE — AB
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
## 2015-06-18 ENCOUNTER — Other Ambulatory Visit (HOSPITAL_COMMUNITY): Payer: Self-pay

## 2015-06-18 DIAGNOSIS — Z930 Tracheostomy status: Secondary | ICD-10-CM | POA: Diagnosis not present

## 2015-06-18 DIAGNOSIS — J9620 Acute and chronic respiratory failure, unspecified whether with hypoxia or hypercapnia: Secondary | ICD-10-CM | POA: Diagnosis not present

## 2015-06-18 LAB — CBC
HEMATOCRIT: 40.5 % (ref 39.0–52.0)
HEMOGLOBIN: 11.6 g/dL — AB (ref 13.0–17.0)
MCH: 27.3 pg (ref 26.0–34.0)
MCHC: 28.6 g/dL — AB (ref 30.0–36.0)
MCV: 95.3 fL (ref 78.0–100.0)
Platelets: 182 10*3/uL (ref 150–400)
RBC: 4.25 MIL/uL (ref 4.22–5.81)
RDW: 19.8 % — AB (ref 11.5–15.5)
WBC: 25.2 10*3/uL — ABNORMAL HIGH (ref 4.0–10.5)

## 2015-06-18 LAB — BASIC METABOLIC PANEL
ANION GAP: 11 (ref 5–15)
BUN: 132 mg/dL — AB (ref 6–20)
CHLORIDE: 121 mmol/L — AB (ref 101–111)
CO2: 20 mmol/L — AB (ref 22–32)
Calcium: 9 mg/dL (ref 8.9–10.3)
Creatinine, Ser: 1.98 mg/dL — ABNORMAL HIGH (ref 0.61–1.24)
GFR calc Af Amer: 35 mL/min — ABNORMAL LOW (ref >60)
GFR, EST NON AFRICAN AMERICAN: 30 mL/min — AB (ref >60)
GLUCOSE: 314 mg/dL — AB (ref 65–99)
POTASSIUM: 4.8 mmol/L (ref 3.5–5.1)
Sodium: 152 mmol/L — ABNORMAL HIGH (ref 135–145)
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
## 2015-06-19 LAB — CULTURE, BLOOD (SINGLE): CULTURE: NO GROWTH
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
## 2015-06-20 ENCOUNTER — Other Ambulatory Visit (HOSPITAL_COMMUNITY): Payer: Self-pay

## 2015-06-20 LAB — CBC
HEMATOCRIT: 32.3 % — AB (ref 39.0–52.0)
Hemoglobin: 9.8 g/dL — ABNORMAL LOW (ref 13.0–17.0)
MCH: 28.7 pg (ref 26.0–34.0)
MCHC: 30.3 g/dL (ref 30.0–36.0)
MCV: 94.7 fL (ref 78.0–100.0)
PLATELETS: 150 10*3/uL (ref 150–400)
RBC: 3.41 MIL/uL — ABNORMAL LOW (ref 4.22–5.81)
RDW: 20.3 % — AB (ref 11.5–15.5)
WBC: 16 10*3/uL — AB (ref 4.0–10.5)

## 2015-06-20 LAB — BASIC METABOLIC PANEL
ANION GAP: 9 (ref 5–15)
BUN: 65 mg/dL — AB (ref 6–20)
CALCIUM: 8.1 mg/dL — AB (ref 8.9–10.3)
CO2: 19 mmol/L — AB (ref 22–32)
Chloride: 116 mmol/L — ABNORMAL HIGH (ref 101–111)
Creatinine, Ser: 1.41 mg/dL — ABNORMAL HIGH (ref 0.61–1.24)
GFR calc Af Amer: 53 mL/min — ABNORMAL LOW (ref >60)
GFR calc non Af Amer: 46 mL/min — ABNORMAL LOW (ref >60)
GLUCOSE: 340 mg/dL — AB (ref 65–99)
POTASSIUM: 3.9 mmol/L (ref 3.5–5.1)
Sodium: 144 mmol/L (ref 135–145)

## 2015-06-22 DIAGNOSIS — J9620 Acute and chronic respiratory failure, unspecified whether with hypoxia or hypercapnia: Secondary | ICD-10-CM | POA: Diagnosis not present

## 2015-06-22 LAB — CBC
HCT: 32.1 % — ABNORMAL LOW (ref 39.0–52.0)
Hemoglobin: 9.5 g/dL — ABNORMAL LOW (ref 13.0–17.0)
MCH: 27.5 pg (ref 26.0–34.0)
MCHC: 29.6 g/dL — ABNORMAL LOW (ref 30.0–36.0)
MCV: 93 fL (ref 78.0–100.0)
PLATELETS: 143 10*3/uL — AB (ref 150–400)
RBC: 3.45 MIL/uL — AB (ref 4.22–5.81)
RDW: 20.8 % — ABNORMAL HIGH (ref 11.5–15.5)
WBC: 13.1 10*3/uL — ABNORMAL HIGH (ref 4.0–10.5)

## 2015-06-22 LAB — BASIC METABOLIC PANEL
ANION GAP: 5 (ref 5–15)
BUN: 37 mg/dL — ABNORMAL HIGH (ref 6–20)
CALCIUM: 8.6 mg/dL — AB (ref 8.9–10.3)
CO2: 21 mmol/L — ABNORMAL LOW (ref 22–32)
Chloride: 119 mmol/L — ABNORMAL HIGH (ref 101–111)
Creatinine, Ser: 1.02 mg/dL (ref 0.61–1.24)
GFR calc Af Amer: >60 mL/min (ref >60)
Glucose, Bld: 155 mg/dL — ABNORMAL HIGH (ref 65–99)
POTASSIUM: 3.9 mmol/L (ref 3.5–5.1)
SODIUM: 145 mmol/L (ref 135–145)
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
## 2015-06-24 LAB — BASIC METABOLIC PANEL
ANION GAP: 9 (ref 5–15)
BUN: 39 mg/dL — ABNORMAL HIGH (ref 6–20)
CALCIUM: 8.7 mg/dL — AB (ref 8.9–10.3)
CO2: 18 mmol/L — ABNORMAL LOW (ref 22–32)
Chloride: 116 mmol/L — ABNORMAL HIGH (ref 101–111)
Creatinine, Ser: 1.02 mg/dL (ref 0.61–1.24)
GLUCOSE: 171 mg/dL — AB (ref 65–99)
POTASSIUM: 3.9 mmol/L (ref 3.5–5.1)
Sodium: 143 mmol/L (ref 135–145)

## 2015-06-25 ENCOUNTER — Other Ambulatory Visit (HOSPITAL_COMMUNITY): Payer: Self-pay

## 2015-06-25 DIAGNOSIS — Z930 Tracheostomy status: Secondary | ICD-10-CM | POA: Diagnosis not present

## 2015-06-25 DIAGNOSIS — G931 Anoxic brain damage, not elsewhere classified: Secondary | ICD-10-CM

## 2015-06-25 DIAGNOSIS — J9620 Acute and chronic respiratory failure, unspecified whether with hypoxia or hypercapnia: Secondary | ICD-10-CM | POA: Diagnosis not present
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
## 2015-06-26 ENCOUNTER — Other Ambulatory Visit (HOSPITAL_COMMUNITY): Payer: Self-pay

## 2015-06-26 LAB — BLOOD GAS, ARTERIAL
Acid-base deficit: 4.6 mmol/L — ABNORMAL HIGH (ref 0.0–2.0)
BICARBONATE: 20.6 meq/L (ref 20.0–24.0)
FIO2: 0.8
LHR: 16 {breaths}/min
O2 SAT: 98.6 %
PATIENT TEMPERATURE: 96.5
PCO2 ART: 39.3 mmHg (ref 35.0–45.0)
PEEP/CPAP: 5 cmH2O
PO2 ART: 120 mmHg — AB (ref 80.0–100.0)
PRESSURE CONTROL: 10 cmH2O
TCO2: 21.9 mmol/L (ref 0–100)
pH, Arterial: 7.331 — ABNORMAL LOW (ref 7.350–7.450)

## 2015-06-26 LAB — CBC
HCT: 31 % — ABNORMAL LOW (ref 39.0–52.0)
Hemoglobin: 9.3 g/dL — ABNORMAL LOW (ref 13.0–17.0)
MCH: 28.2 pg (ref 26.0–34.0)
MCHC: 30 g/dL (ref 30.0–36.0)
MCV: 93.9 fL (ref 78.0–100.0)
PLATELETS: 190 10*3/uL (ref 150–400)
RBC: 3.3 MIL/uL — AB (ref 4.22–5.81)
RDW: 21 % — ABNORMAL HIGH (ref 11.5–15.5)
WBC: 12.9 10*3/uL — ABNORMAL HIGH (ref 4.0–10.5)

## 2015-06-27 ENCOUNTER — Other Ambulatory Visit (HOSPITAL_COMMUNITY): Payer: Self-pay

## 2015-06-27 LAB — BASIC METABOLIC PANEL
Anion gap: 8 (ref 5–15)
BUN: 61 mg/dL — AB (ref 6–20)
CALCIUM: 9.1 mg/dL (ref 8.9–10.3)
CO2: 22 mmol/L (ref 22–32)
CREATININE: 1.25 mg/dL — AB (ref 0.61–1.24)
Chloride: 111 mmol/L (ref 101–111)
GFR calc Af Amer: >60 mL/min (ref >60)
GFR, EST NON AFRICAN AMERICAN: 53 mL/min — AB (ref >60)
GLUCOSE: 307 mg/dL — AB (ref 65–99)
Potassium: 5.6 mmol/L — ABNORMAL HIGH (ref 3.5–5.1)
SODIUM: 141 mmol/L (ref 135–145)

## 2015-06-27 LAB — CBC
HCT: 32 % — ABNORMAL LOW (ref 39.0–52.0)
Hemoglobin: 9.1 g/dL — ABNORMAL LOW (ref 13.0–17.0)
MCH: 27.8 pg (ref 26.0–34.0)
MCHC: 28.4 g/dL — ABNORMAL LOW (ref 30.0–36.0)
MCV: 97.9 fL (ref 78.0–100.0)
PLATELETS: 192 10*3/uL (ref 150–400)
RBC: 3.27 MIL/uL — ABNORMAL LOW (ref 4.22–5.81)
RDW: 20.6 % — AB (ref 11.5–15.5)
WBC: 13.9 10*3/uL — ABNORMAL HIGH (ref 4.0–10.5)

## 2015-06-27 LAB — MAGNESIUM: MAGNESIUM: 2.1 mg/dL (ref 1.7–2.4)

## 2015-06-28 ENCOUNTER — Other Ambulatory Visit (HOSPITAL_COMMUNITY): Payer: Self-pay

## 2015-06-28 LAB — C-REACTIVE PROTEIN: CRP: 7.7 mg/dL — ABNORMAL HIGH (ref <1.0)

## 2015-06-28 LAB — CBC
HCT: 30.8 % — ABNORMAL LOW (ref 39.0–52.0)
Hemoglobin: 8.7 g/dL — ABNORMAL LOW (ref 13.0–17.0)
MCH: 27.7 pg (ref 26.0–34.0)
MCHC: 28.2 g/dL — ABNORMAL LOW (ref 30.0–36.0)
MCV: 98.1 fL (ref 78.0–100.0)
PLATELETS: 206 10*3/uL (ref 150–400)
RBC: 3.14 MIL/uL — AB (ref 4.22–5.81)
RDW: 20.8 % — AB (ref 11.5–15.5)
WBC: 14.7 10*3/uL — AB (ref 4.0–10.5)

## 2015-06-28 LAB — BASIC METABOLIC PANEL
ANION GAP: 9 (ref 5–15)
BUN: 75 mg/dL — AB (ref 6–20)
CALCIUM: 9.6 mg/dL (ref 8.9–10.3)
CO2: 27 mmol/L (ref 22–32)
Chloride: 109 mmol/L (ref 101–111)
Creatinine, Ser: 1.16 mg/dL (ref 0.61–1.24)
GFR calc Af Amer: >60 mL/min (ref >60)
GFR, EST NON AFRICAN AMERICAN: 58 mL/min — AB (ref >60)
GLUCOSE: 257 mg/dL — AB (ref 65–99)
Potassium: 5.6 mmol/L — ABNORMAL HIGH (ref 3.5–5.1)
SODIUM: 145 mmol/L (ref 135–145)

## 2015-06-28 LAB — MAGNESIUM: MAGNESIUM: 2.3 mg/dL (ref 1.7–2.4)

## 2015-06-30 LAB — CULTURE, RESPIRATORY W GRAM STAIN

## 2015-06-30 LAB — CULTURE, RESPIRATORY

## 2015-07-11 DEATH — deceased

## 2016-06-14 IMAGING — CR DG CHEST 1V PORT
1 series · 1 of 1 positions shown · non-contrast
Comparison: 05/12/2015

CLINICAL DATA: Worsening bibasilar airspace process

EXAM:
PORTABLE CHEST 1 VIEW

[AP]
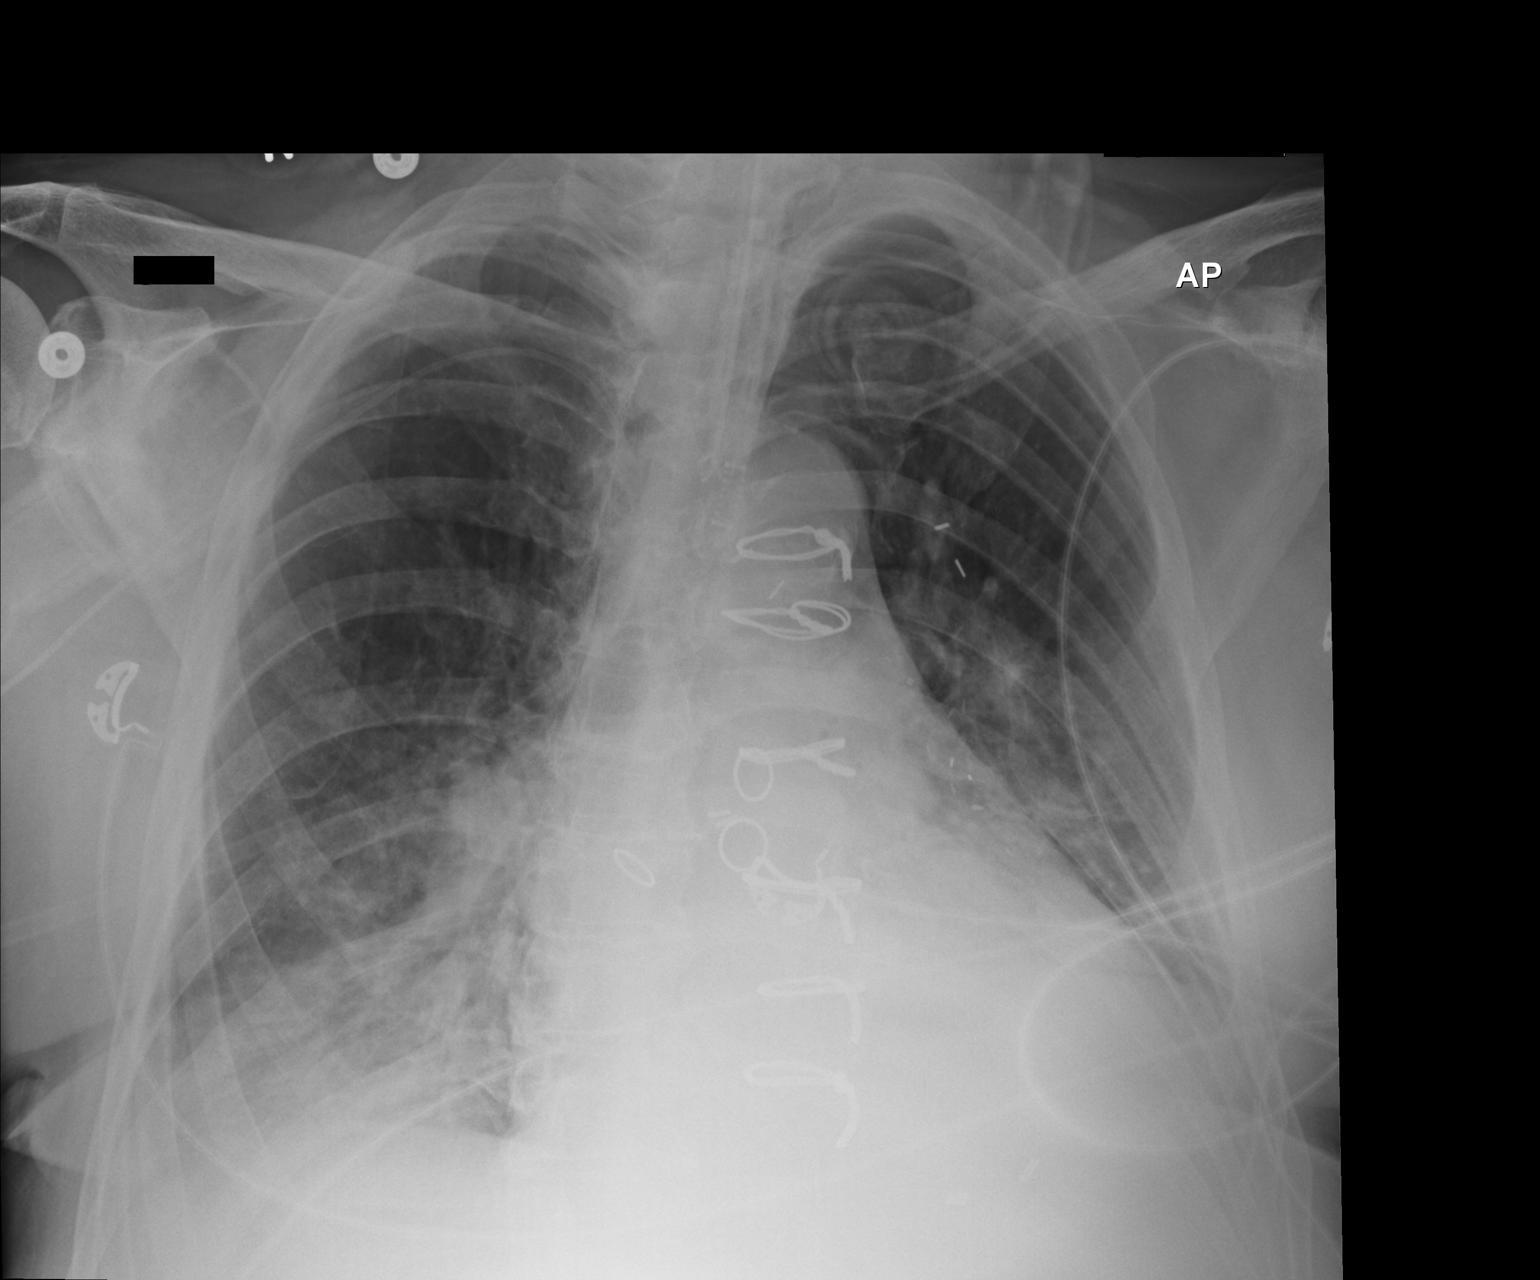

[1 of 1 positions shown; findings below may reference images not displayed]

FINDINGS: Endotracheal tube remains in the mid trachea as before. Right PICC
line tip at the innominate SVC junction. Previous coronary bypass
changes noted. Increased bibasilar airspace opacities with dense
left lower lobe consolidation obscuring the left hemidiaphragm. No
enlarging effusion or pneumothorax.
IMPRESSION: Worsening bibasilar airspace process with left base consolidation
concerning for basilar pneumonia.

## 2016-06-17 IMAGING — CR DG CHEST 1V PORT
1 series · 2 of 2 positions shown · non-contrast
Comparison: 05/17/2015 and 05/15/2015.

CLINICAL DATA: New tracheostomy.

EXAM:
PORTABLE CHEST 1 VIEW

[Series 4: ap portable · 0.17mm/px · 2 of 2 slices shown]
[im 1/2]
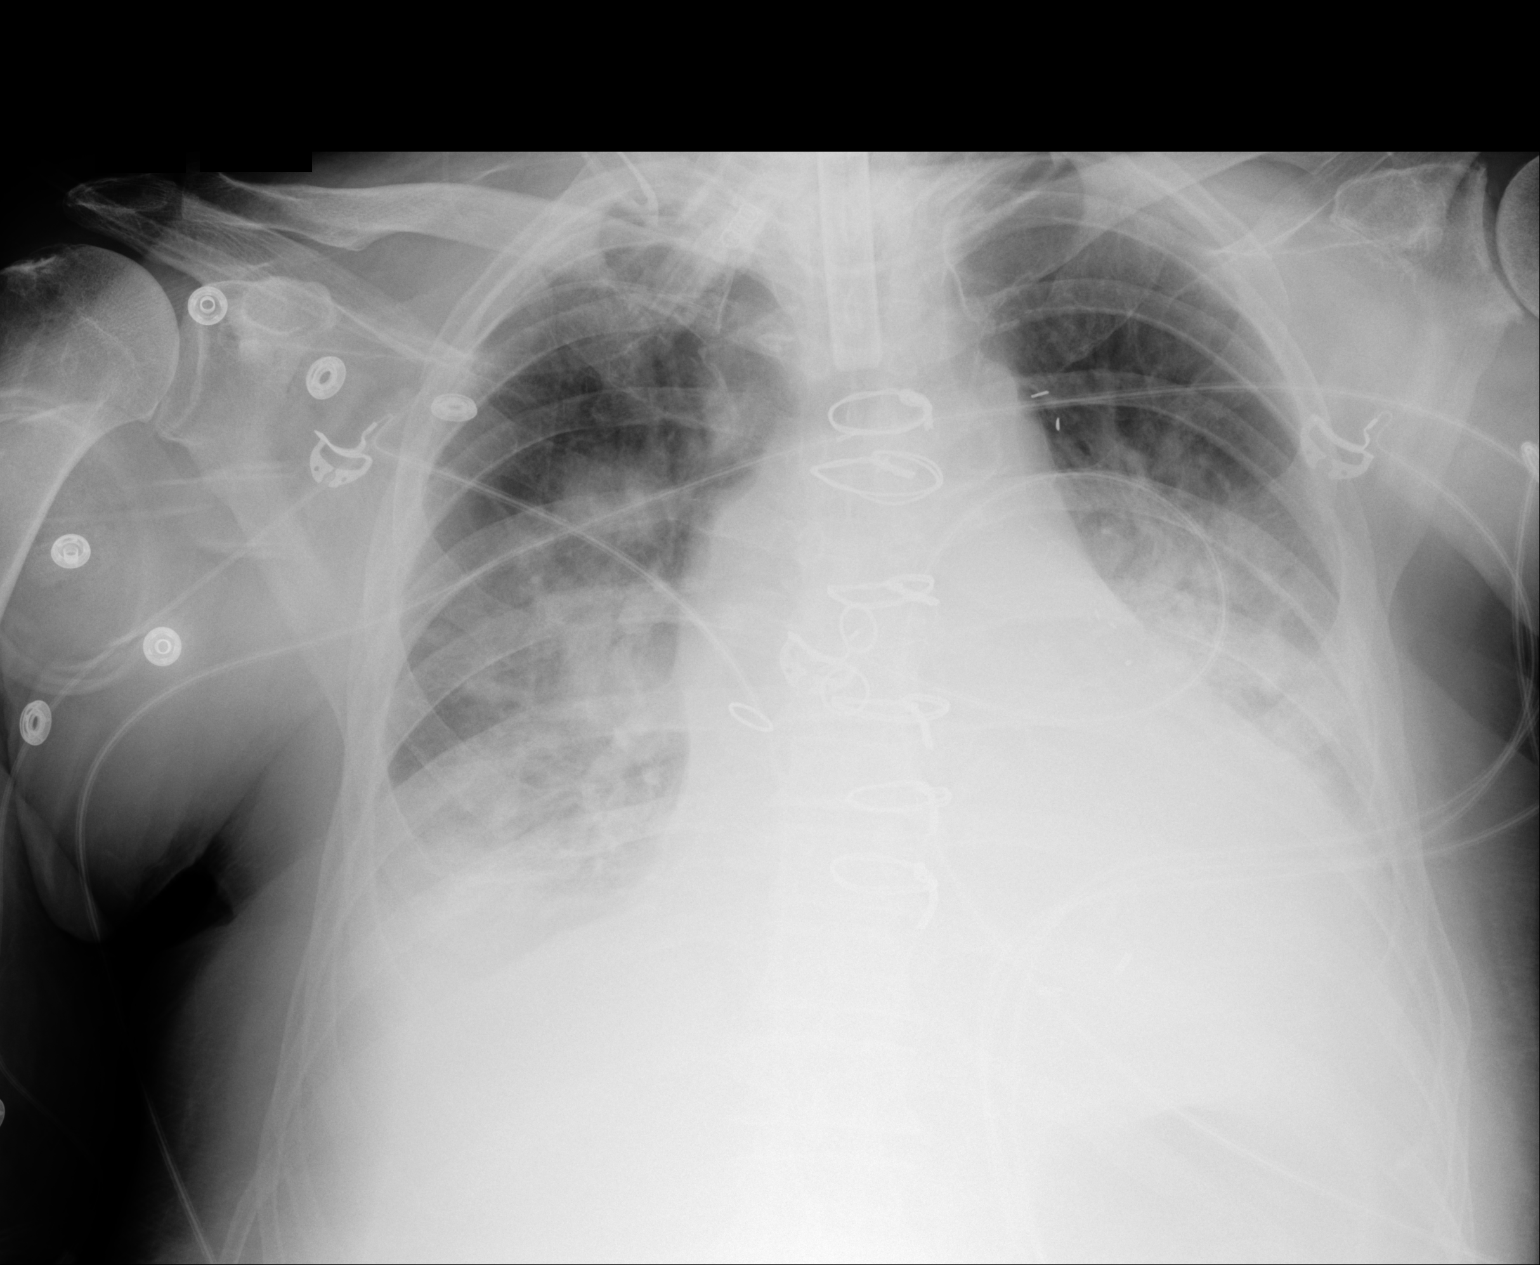
[im 2/2]
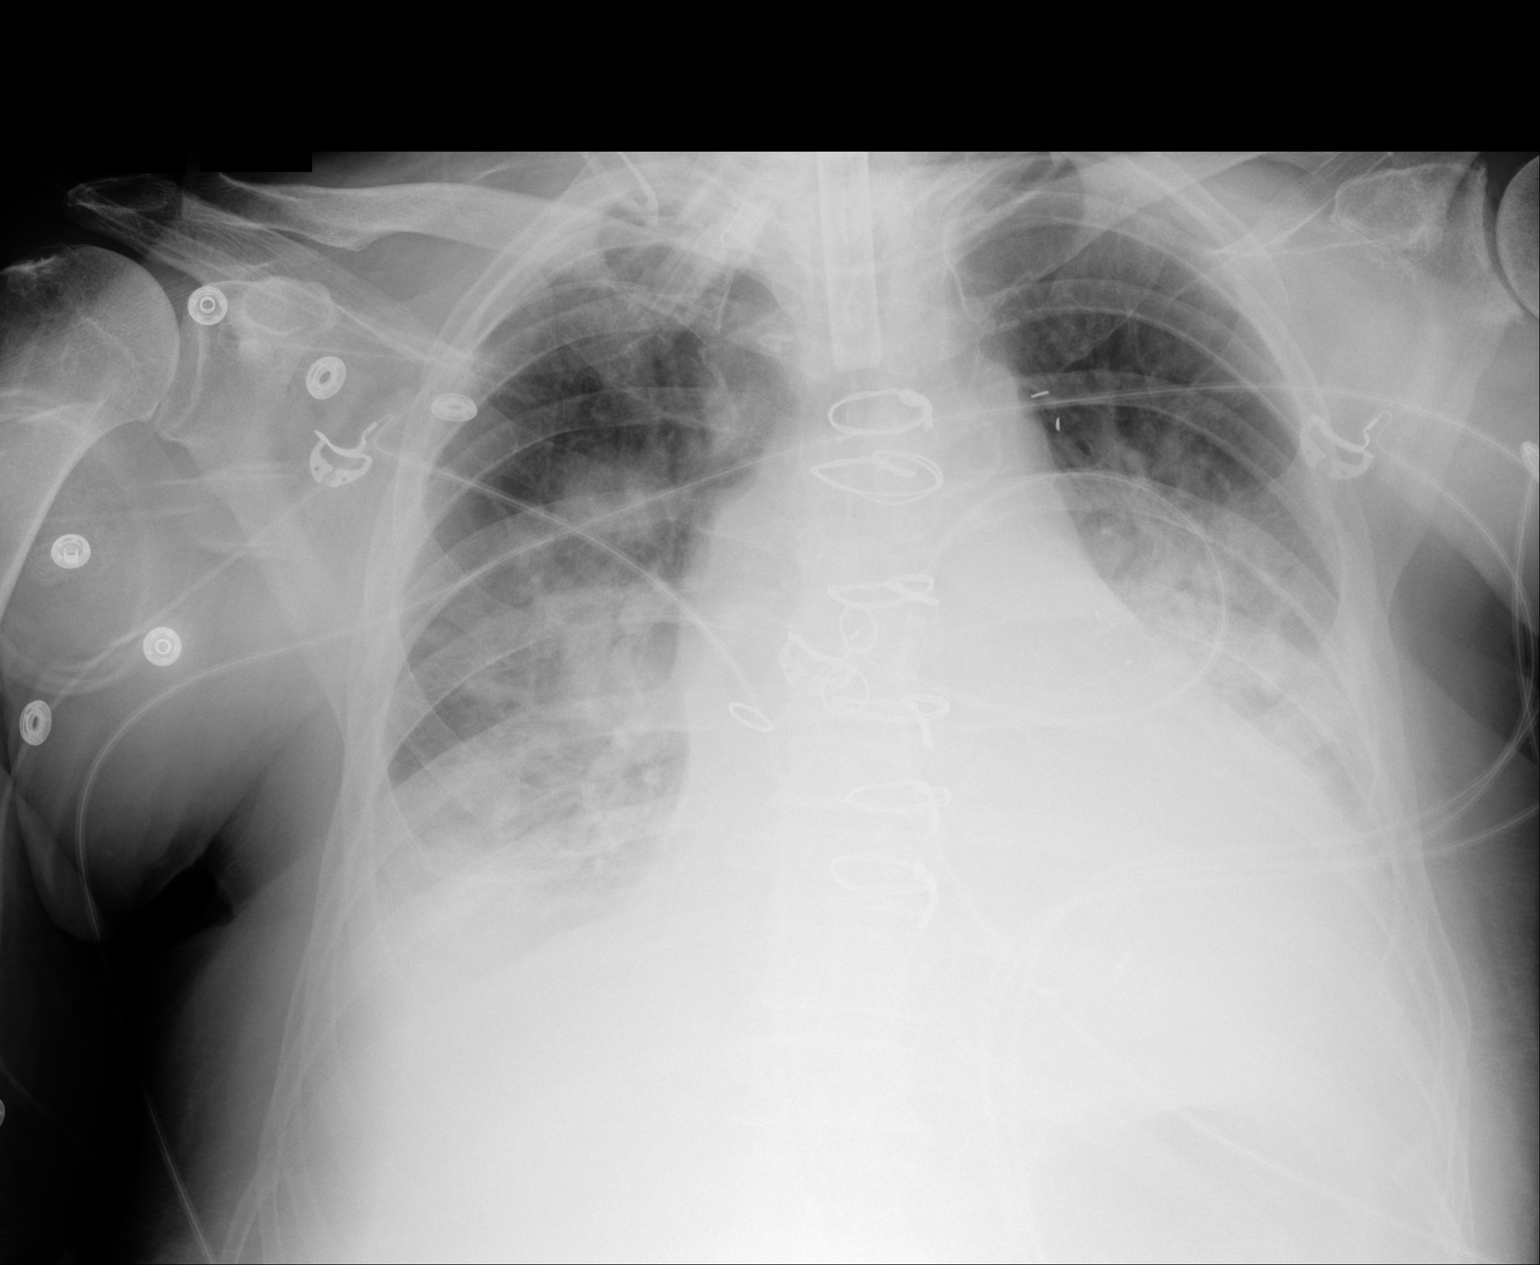

[2 of 2 positions shown; findings below may reference images not displayed]

FINDINGS: 6240 hours. Interval tracheostomy. The new tracheostomy appears well
position. The heart size and mediastinal contours are stable status
post CABG. The right arm PICC has been retracted with the tip
overlying the right clavicular head. There are worsening bibasilar
airspace opacities and probable bilateral pleural effusions. No
evidence of pneumothorax. The bones appear unchanged.
IMPRESSION: 1. Interval tracheostomy without complicating pneumothorax.
2. Worsening bibasilar airspace opacities, probably edema.
3. Right arm PICC tip has been withdrawn, tip now overlying the
right clavicular head.

## 2016-06-19 IMAGING — CR DG CHEST 1V PORT
1 series · 1 of 1 positions shown · non-contrast
Comparison: 05/19/2015

CLINICAL DATA: Pneumonia

EXAM:
PORTABLE CHEST 1 VIEW

[AP]
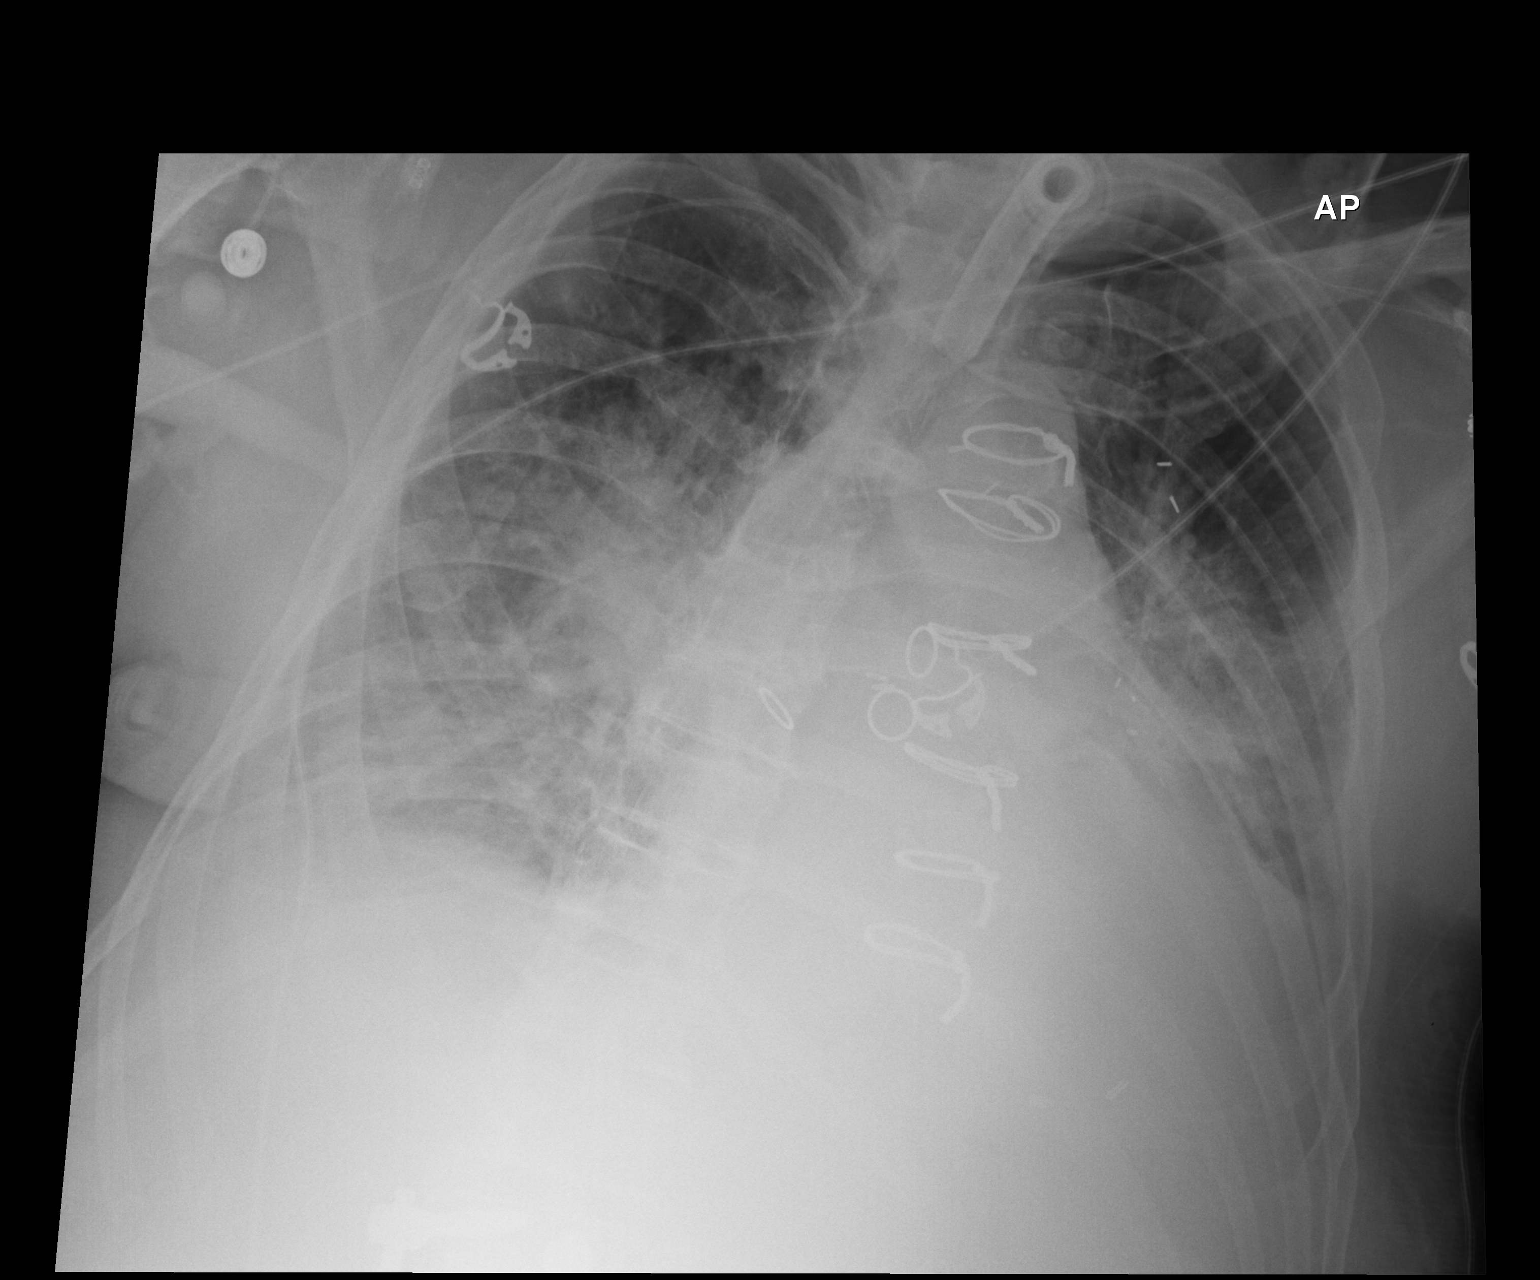

[1 of 1 positions shown; findings below may reference images not displayed]

FINDINGS: Tracheostomy tube unchanged. Stable enlarged cardiac silhouette.
There is dense LEFT basilar opacity. There is perihilar airspace
disease not changed.
IMPRESSION: 1. No change in mild central pulmonary edema.
2. No change in LEFT retrocardiac opacity and effusion.

## 2016-06-19 IMAGING — CR DG ABD PORTABLE 1V
1 series · 1 of 1 positions shown · non-contrast
Comparison: None.

CLINICAL DATA: PEG adjustment/replacement/removal

EXAM:
PORTABLE ABDOMEN - 1 VIEW

[AP]
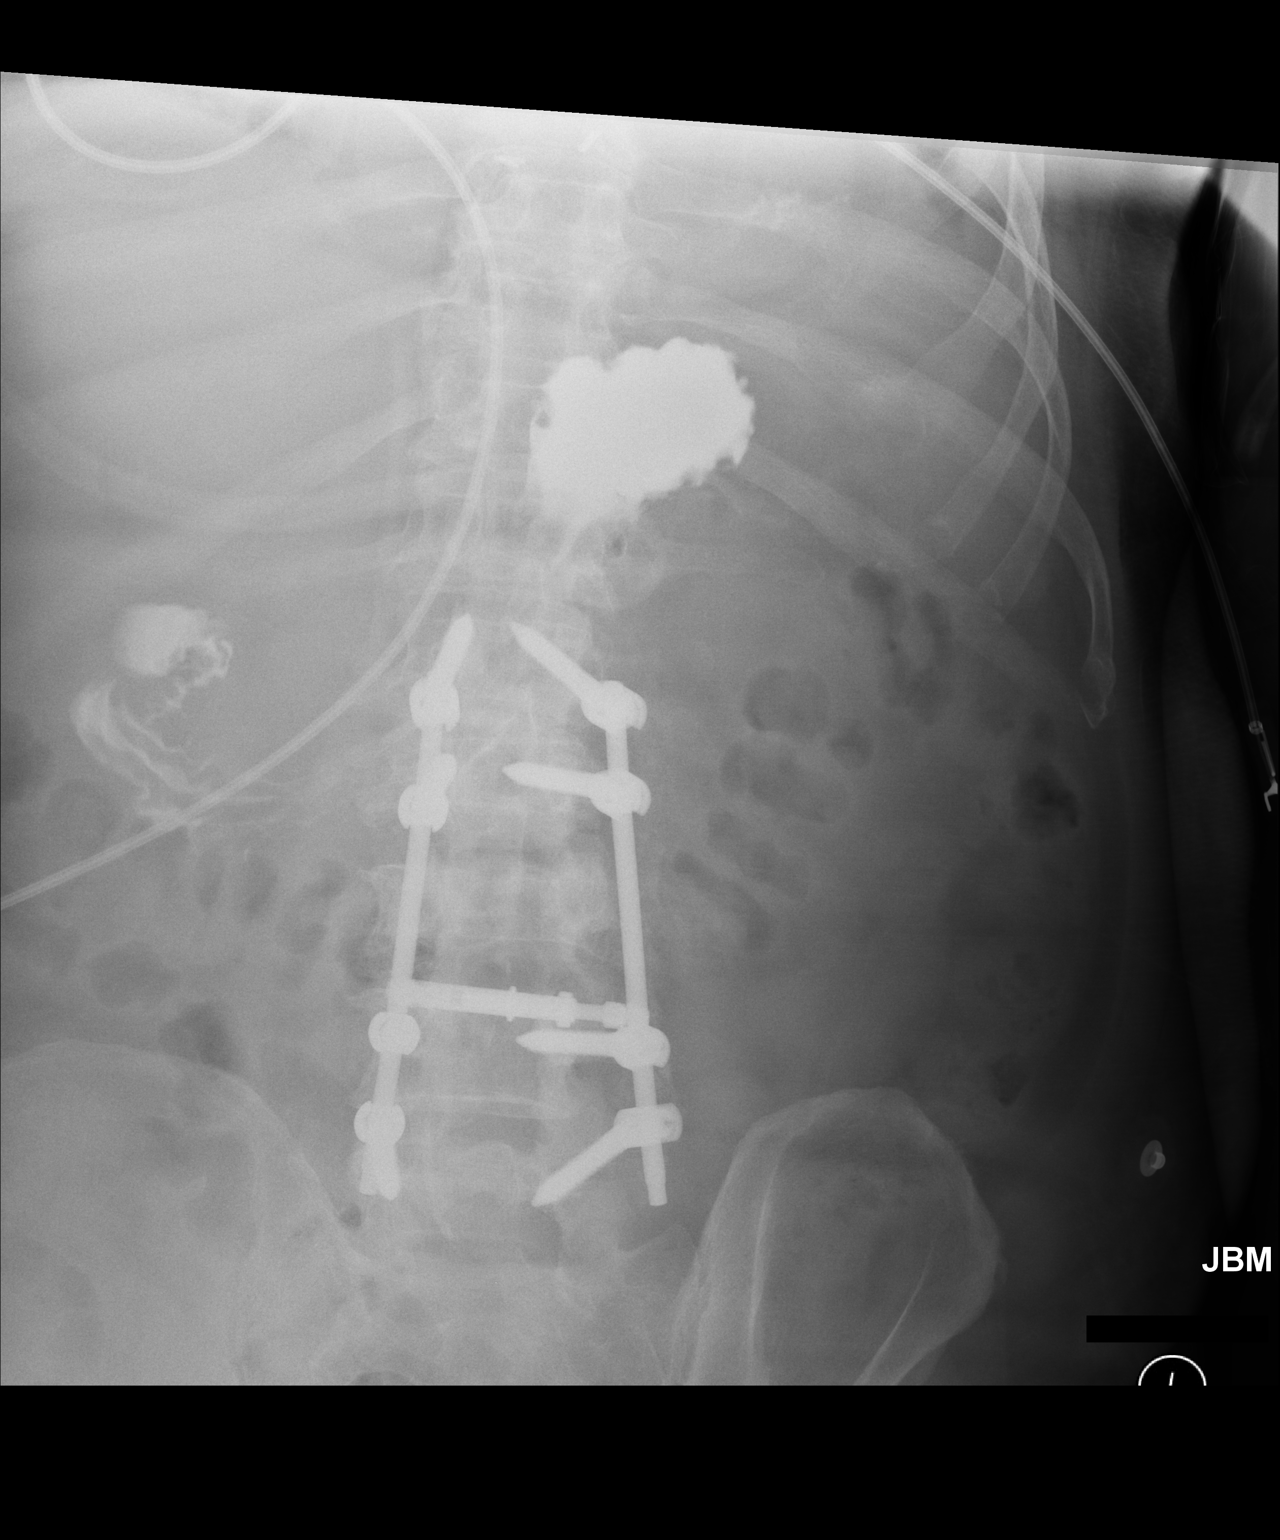

[1 of 1 positions shown; findings below may reference images not displayed]

FINDINGS: Contrast opacifies the stomach, confirming intragastric placement.

Nonobstructive bowel gas pattern.

Lumbar spine fixation hardware.
IMPRESSION: Contrast opacifies the stomach, confirming intragastric placement.

## 2016-06-20 IMAGING — DX DG CHEST 1V PORT
1 series · 1 of 1 positions shown · non-contrast
Comparison: 05/20/2015

CLINICAL DATA: Respiratory failure

EXAM:
PORTABLE CHEST 1 VIEW

[chest ap]
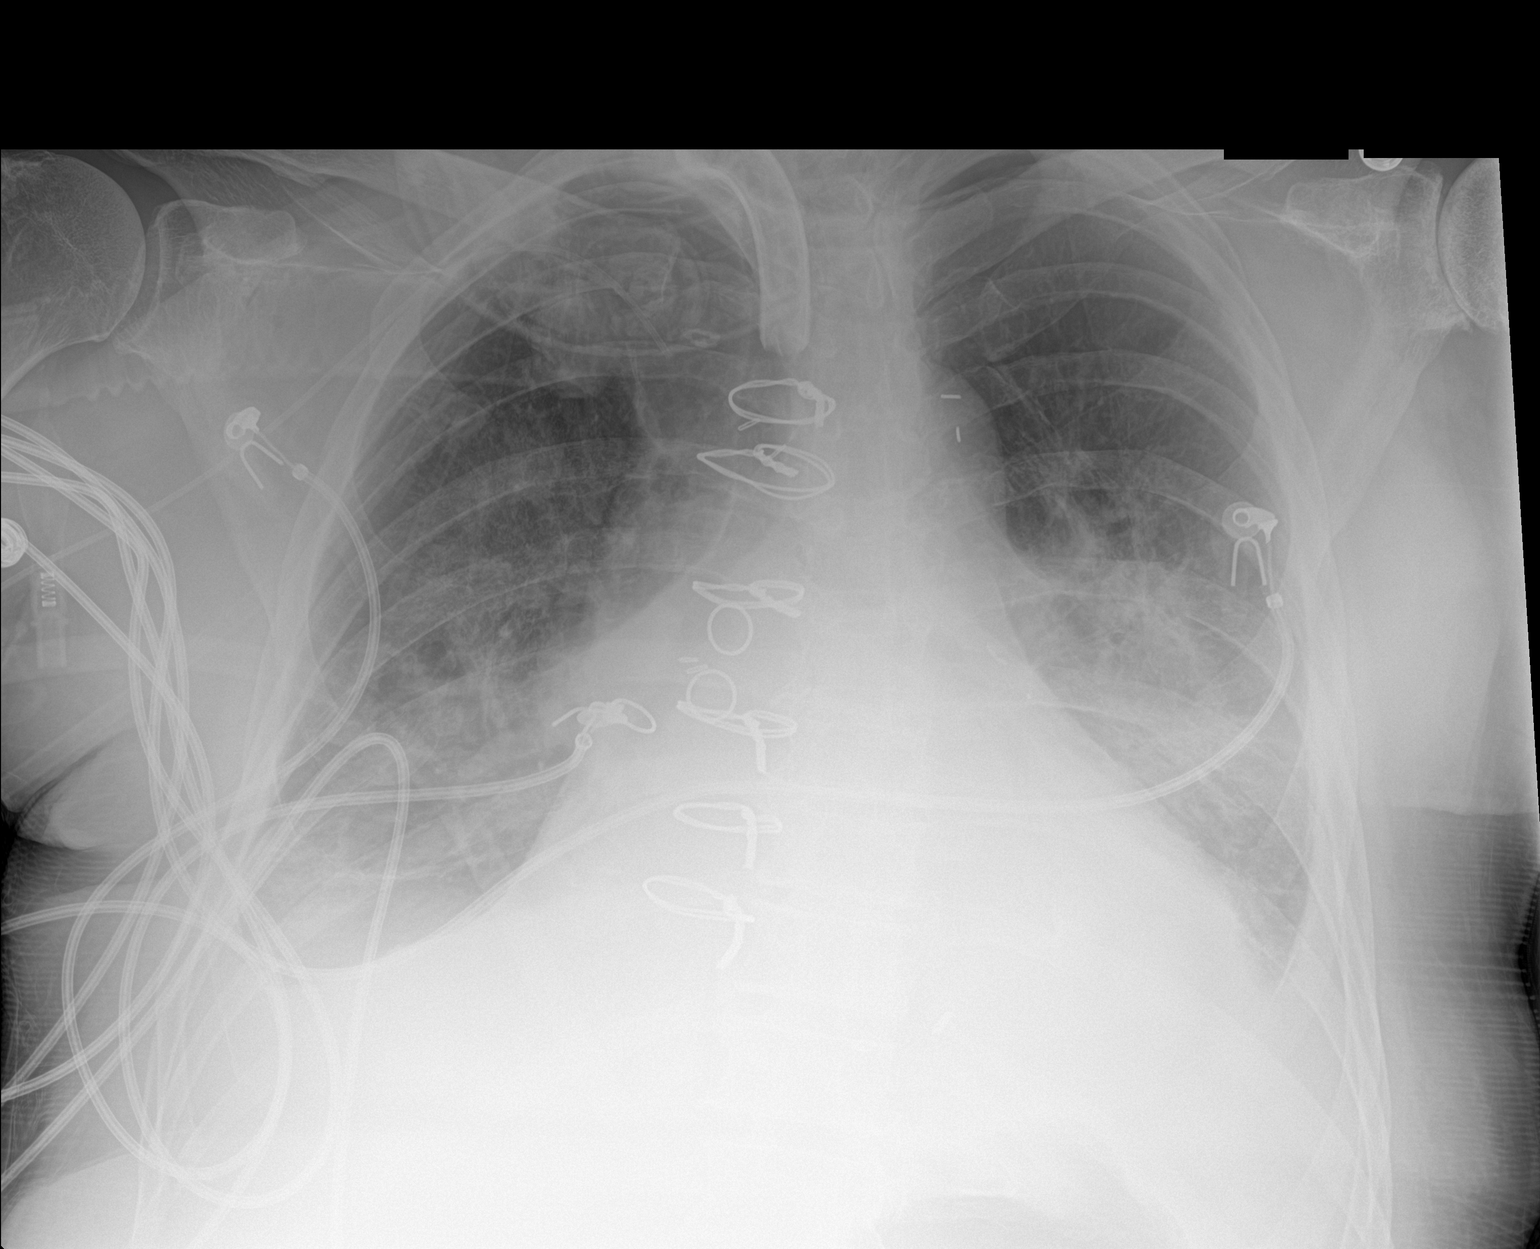

[1 of 1 positions shown; findings below may reference images not displayed]

FINDINGS: Cardiac shadow is again enlarged. Postsurgical changes are again
seen. Tracheostomy catheter and right-sided PICC line are again
noted and stable. Bilateral pleural effusions are seen as well as
patchy perihilar infiltrates bilaterally. No significant change from
the prior exam is noted.
IMPRESSION: No significant change from the previous day
